# Patient Record
Sex: Female | Born: 1970 | Race: Black or African American | Hispanic: No | Marital: Married | Smoking: Never smoker
Health system: Southern US, Community
[De-identification: ages and names within clinical notes are randomized; demographics above are authoritative.]

## PROBLEM LIST (undated history)

## (undated) ENCOUNTER — Inpatient Hospital Stay (HOSPITAL_COMMUNITY): Payer: Self-pay

## (undated) DIAGNOSIS — D649 Anemia, unspecified: Secondary | ICD-10-CM

## (undated) DIAGNOSIS — E785 Hyperlipidemia, unspecified: Secondary | ICD-10-CM

## (undated) DIAGNOSIS — A6000 Herpesviral infection of urogenital system, unspecified: Secondary | ICD-10-CM

## (undated) HISTORY — DX: Hyperlipidemia, unspecified: E78.5

## (undated) HISTORY — PX: HYSTEROSCOPY: SHX211

## (undated) HISTORY — PX: TUBAL LIGATION: SHX77

## (undated) HISTORY — PX: CERVICAL POLYPECTOMY: SHX88

---

## 1998-04-22 ENCOUNTER — Emergency Department (HOSPITAL_COMMUNITY): Admission: EM | Admit: 1998-04-22 | Discharge: 1998-04-22 | Payer: Self-pay | Admitting: Emergency Medicine

## 1999-04-04 ENCOUNTER — Other Ambulatory Visit: Admission: RE | Admit: 1999-04-04 | Discharge: 1999-04-04 | Payer: Self-pay | Admitting: Obstetrics

## 1999-04-29 ENCOUNTER — Inpatient Hospital Stay (HOSPITAL_COMMUNITY): Admission: AD | Admit: 1999-04-29 | Discharge: 1999-04-29 | Payer: Self-pay | Admitting: Obstetrics

## 1999-08-02 ENCOUNTER — Other Ambulatory Visit: Admission: RE | Admit: 1999-08-02 | Discharge: 1999-08-02 | Payer: Self-pay | Admitting: Obstetrics

## 1999-11-01 ENCOUNTER — Inpatient Hospital Stay (HOSPITAL_COMMUNITY): Admission: AD | Admit: 1999-11-01 | Discharge: 1999-11-01 | Payer: Self-pay | Admitting: Internal Medicine

## 1999-11-15 ENCOUNTER — Inpatient Hospital Stay (HOSPITAL_COMMUNITY): Admission: AD | Admit: 1999-11-15 | Discharge: 1999-11-15 | Payer: Self-pay | Admitting: Obstetrics

## 1999-11-23 ENCOUNTER — Inpatient Hospital Stay (HOSPITAL_COMMUNITY): Admission: AD | Admit: 1999-11-23 | Discharge: 1999-11-25 | Payer: Self-pay | Admitting: Obstetrics

## 2000-09-17 ENCOUNTER — Other Ambulatory Visit: Admission: RE | Admit: 2000-09-17 | Discharge: 2000-09-17 | Payer: Self-pay | Admitting: Obstetrics

## 2001-12-02 ENCOUNTER — Encounter: Admission: RE | Admit: 2001-12-02 | Discharge: 2001-12-02 | Payer: Self-pay | Admitting: Obstetrics

## 2001-12-02 ENCOUNTER — Encounter: Payer: Self-pay | Admitting: Obstetrics

## 2002-05-29 ENCOUNTER — Encounter: Payer: Self-pay | Admitting: Obstetrics

## 2002-05-29 ENCOUNTER — Encounter: Admission: RE | Admit: 2002-05-29 | Discharge: 2002-05-29 | Payer: Self-pay | Admitting: Obstetrics

## 2009-08-13 ENCOUNTER — Ambulatory Visit (HOSPITAL_COMMUNITY): Admission: RE | Admit: 2009-08-13 | Discharge: 2009-08-13 | Payer: Self-pay | Admitting: Obstetrics

## 2009-09-09 ENCOUNTER — Ambulatory Visit (HOSPITAL_COMMUNITY): Admission: RE | Admit: 2009-09-09 | Discharge: 2009-09-09 | Payer: Self-pay | Admitting: Obstetrics

## 2011-01-25 LAB — CBC
HCT: 28.9 % — ABNORMAL LOW (ref 36.0–46.0)
Hemoglobin: 9.6 g/dL — ABNORMAL LOW (ref 12.0–15.0)
MCHC: 33.1 g/dL (ref 30.0–36.0)
MCV: 84.1 fL (ref 78.0–100.0)
Platelets: 226 10*3/uL (ref 150–400)
RBC: 3.44 MIL/uL — ABNORMAL LOW (ref 3.87–5.11)
RDW: 15.8 % — ABNORMAL HIGH (ref 11.5–15.5)
WBC: 6.4 10*3/uL (ref 4.0–10.5)

## 2011-10-31 ENCOUNTER — Encounter (HOSPITAL_COMMUNITY): Payer: Self-pay

## 2011-10-31 ENCOUNTER — Inpatient Hospital Stay (HOSPITAL_COMMUNITY): Payer: BC Managed Care – PPO

## 2011-10-31 ENCOUNTER — Inpatient Hospital Stay (HOSPITAL_COMMUNITY)
Admission: AD | Admit: 2011-10-31 | Discharge: 2011-10-31 | Disposition: A | Discharge status: Home / Self Care | Payer: BC Managed Care – PPO | Source: Ambulatory Visit | Attending: Obstetrics | Admitting: Obstetrics

## 2011-10-31 DIAGNOSIS — O99891 Other specified diseases and conditions complicating pregnancy: Secondary | ICD-10-CM | POA: Insufficient documentation

## 2011-10-31 DIAGNOSIS — R109 Unspecified abdominal pain: Secondary | ICD-10-CM

## 2011-10-31 DIAGNOSIS — O26899 Other specified pregnancy related conditions, unspecified trimester: Secondary | ICD-10-CM

## 2011-10-31 HISTORY — DX: Herpesviral infection of urogenital system, unspecified: A60.00

## 2011-10-31 HISTORY — DX: Anemia, unspecified: D64.9

## 2011-10-31 LAB — URINALYSIS, ROUTINE W REFLEX MICROSCOPIC
Bilirubin Urine: NEGATIVE
Glucose, UA: NEGATIVE mg/dL
Hgb urine dipstick: NEGATIVE
Ketones, ur: 15 mg/dL — AB
Leukocytes, UA: NEGATIVE
Protein, ur: NEGATIVE mg/dL
pH: 6 (ref 5.0–8.0)

## 2011-10-31 LAB — CBC
HCT: 32.5 % — ABNORMAL LOW (ref 36.0–46.0)
Hemoglobin: 10.7 g/dL — ABNORMAL LOW (ref 12.0–15.0)
MCH: 28.8 pg (ref 26.0–34.0)
MCHC: 32.9 g/dL (ref 30.0–36.0)
MCV: 87.4 fL (ref 78.0–100.0)
RBC: 3.72 MIL/uL — ABNORMAL LOW (ref 3.87–5.11)

## 2011-10-31 MED ORDER — NIFEDIPINE ER OSMOTIC RELEASE 60 MG PO TB24: 60.0000 mg | ORAL_TABLET | Freq: Every day | ORAL | Status: DC

## 2011-10-31 MED ORDER — TERBUTALINE SULFATE 1 MG/ML IJ SOLN
0.2500 mg | Freq: Once | INTRAMUSCULAR | Status: AC
  Administered 2011-10-31: 0.25 mg via SUBCUTANEOUS
  Filled 2011-10-31: qty 1

## 2011-10-31 MED ORDER — LACTATED RINGERS IV BOLUS (SEPSIS)
1000.0000 mL | Freq: Once | INTRAVENOUS | Status: AC
  Administered 2011-10-31: 1000 mL via INTRAVENOUS

## 2011-10-31 NOTE — ED Provider Notes (Signed)
History   Pt presents today c/o severe lower abd pain that has been present for the last couple of days. She was seen in [facility] yesterday and told everything was fine. However, the pain continues so she came to [facility]U. She denies recent intercourse, vag bleeding, dysuria, fever, or any other sx at this time. She states she feels like she is having ctx.  Chief Complaint  Patient presents with  . Abdominal Pain   HPI  OB History    Grav Para Term Preterm Abortions TAB SAB Ect Mult Living   4 2 2  1 1    2       Past Medical History  Diagnosis Date  . Anemia   . Gestational diabetes   . Herpes genitalia     Past Surgical History  Procedure Date  . Cervical polypectomy     Family History  Problem Relation Age of Onset  . Anesthesia problems Neg Hx     History  Substance Use Topics  . Smoking status: Never Smoker   . Smokeless tobacco: Not on file  . Alcohol Use: No    Allergies:  Allergies  Allergen Reactions  . Amoxicillin Hives  . Penicillins Hives    Prescriptions prior to admission  Medication Sig Dispense Refill  . Prenatal Vit-Fe Fumarate-FA (PRENATAL MULTIVITAMIN) TABS Take 1 tablet by mouth daily.          Review of Systems  Constitutional: Negative for fever and malaise/fatigue.  Eyes: Negative for blurred vision.  Cardiovascular: Negative for chest pain and palpitations.  Gastrointestinal: Positive for abdominal pain. Negative for nausea, vomiting, diarrhea and constipation.  Genitourinary: Negative for dysuria, urgency, frequency and hematuria.  Neurological: Negative for dizziness and headaches.  Psychiatric/Behavioral: Negative for depression and suicidal ideas.   Physical Exam   Blood pressure 108/59, pulse 92, temperature 99 F (37.2 C), temperature source Oral, resp. rate 16, height 5' 4.5" (1.638 m), weight 216 lb 12.8 oz (98.34 kg), SpO2 99.00%.  Physical Exam  Nursing note and vitals reviewed. Constitutional: She is oriented to person,  place, and time. She appears well-developed and well-nourished. No distress.  HENT:  Head: Normocephalic and atraumatic.  Eyes: EOM are normal. Pupils are equal, round, and reactive to light.  GI: Soft. She exhibits no distension and no mass. There is tenderness. There is no rebound and no guarding.  Genitourinary: No bleeding around the vagina. No vaginal discharge found.       Cervix 1/50/-3.  Neurological: She is alert and oriented to person, place, and time.  Skin: Skin is warm and dry. She is not diaphoretic.  Psychiatric: She has a normal mood and affect. Her behavior is normal. Judgment and thought content normal.    MAU Course  Procedures  Discussed pt with Dr. Gaynell Face. He ordered terbutaline and IV hydration.  Results for orders placed during the hospital encounter of 10/31/11 (from the past 24 hour(s))  CBC     Status: Abnormal   Collection Time   10/31/11 11:15 AM      Component Value Range   WBC 11.7 (*) 4.0 - 10.5 (K/uL)   RBC 3.72 (*) 3.87 - 5.11 (MIL/uL)   Hemoglobin 10.7 (*) 12.0 - 15.0 (g/dL)   HCT 16.1 (*) 09.6 - 46.0 (%)   MCV 87.4  78.0 - 100.0 (fL)   MCH 28.8  26.0 - 34.0 (pg)   MCHC 32.9  30.0 - 36.0 (g/dL)   RDW 04.5  40.9 - 81.1 (%)  Platelets 177  150 - 400 (K/uL)  URINALYSIS, ROUTINE W REFLEX MICROSCOPIC     Status: Abnormal   Collection Time   10/31/11 11:15 AM      Component Value Range   Color, Urine YELLOW  YELLOW    APPearance CLEAR  CLEAR    Specific Gravity, Urine 1.025  1.005 - 1.030    pH 6.0  5.0 - 8.0    Glucose, UA NEGATIVE  NEGATIVE (mg/dL)   Hgb urine dipstick NEGATIVE  NEGATIVE    Bilirubin Urine NEGATIVE  NEGATIVE    Ketones, ur 15 (*) NEGATIVE (mg/dL)   Protein, ur NEGATIVE  NEGATIVE (mg/dL)   Urobilinogen, UA 0.2  0.0 - 1.0 (mg/dL)   Nitrite NEGATIVE  NEGATIVE    Leukocytes, UA NEGATIVE  NEGATIVE    US shows cervical length of 4cm. No funneling noted.  Discussed pt with Dr. Gaynell Face. Will start procardia XL 60mg  q day. She  will f/u in the office on Tuesday.  Assessment and Plan  Abd pain in preg: discussed with pt at length. Will start procardia XL 60mg  q day. She will f/u with Dr. Gaynell Face. Discussed diet, activity, risks,a nd precautions.  Lindsay Hurley, DrHSc, MPAS, PA-C  10/31/2011, 11:04 AM   Lindsay Hoover, PA 10/31/11 1314

## 2011-10-31 NOTE — Progress Notes (Signed)
Patient has had no prenatal care. First appointment with Dr. Gaynell Face is 1-15.

## 2011-10-31 NOTE — Progress Notes (Signed)
Patient states she has had abdominal pain since 1-6, low to mid abdomen, that is worse with movement. Was seen at [facility] 1-7 for a complete evaluation but the patient states the pain continues. Patient denies any bleeding, leaking, nausea, vomiting or diarrhea.

## 2011-11-07 LAB — OB RESULTS CONSOLE RUBELLA ANTIBODY, IGM: Rubella: IMMUNE

## 2011-11-07 LAB — OB RESULTS CONSOLE HEPATITIS B SURFACE ANTIGEN: Hepatitis B Surface Ag: NEGATIVE

## 2011-11-07 LAB — OB RESULTS CONSOLE ABO/RH

## 2011-11-07 LAB — OB RESULTS CONSOLE HIV ANTIBODY (ROUTINE TESTING): HIV: NONREACTIVE

## 2011-11-07 LAB — OB RESULTS CONSOLE ANTIBODY SCREEN: Antibody Screen: NEGATIVE

## 2011-11-08 ENCOUNTER — Other Ambulatory Visit (HOSPITAL_COMMUNITY): Payer: Self-pay | Admitting: Obstetrics

## 2011-11-08 DIAGNOSIS — Z0489 Encounter for examination and observation for other specified reasons: Secondary | ICD-10-CM

## 2011-11-15 ENCOUNTER — Encounter (HOSPITAL_COMMUNITY): Payer: Self-pay

## 2011-11-15 ENCOUNTER — Ambulatory Visit (HOSPITAL_COMMUNITY)
Admission: RE | Admit: 2011-11-15 | Discharge: 2011-11-15 | Disposition: A | Discharge status: Home / Self Care | Payer: BC Managed Care – PPO | Source: Ambulatory Visit | Attending: Obstetrics | Admitting: Obstetrics

## 2011-11-15 DIAGNOSIS — O98519 Other viral diseases complicating pregnancy, unspecified trimester: Secondary | ICD-10-CM | POA: Insufficient documentation

## 2011-11-15 DIAGNOSIS — O358XX0 Maternal care for other (suspected) fetal abnormality and damage, not applicable or unspecified: Secondary | ICD-10-CM

## 2011-11-15 DIAGNOSIS — Z1389 Encounter for screening for other disorder: Secondary | ICD-10-CM | POA: Insufficient documentation

## 2011-11-15 DIAGNOSIS — Z363 Encounter for antenatal screening for malformations: Secondary | ICD-10-CM | POA: Insufficient documentation

## 2011-11-15 DIAGNOSIS — O0930 Supervision of pregnancy with insufficient antenatal care, unspecified trimester: Secondary | ICD-10-CM | POA: Insufficient documentation

## 2011-11-15 DIAGNOSIS — Z0489 Encounter for examination and observation for other specified reasons: Secondary | ICD-10-CM

## 2011-11-15 DIAGNOSIS — O09529 Supervision of elderly multigravida, unspecified trimester: Secondary | ICD-10-CM | POA: Insufficient documentation

## 2011-11-15 DIAGNOSIS — A6000 Herpesviral infection of urogenital system, unspecified: Secondary | ICD-10-CM | POA: Insufficient documentation

## 2011-11-15 NOTE — Progress Notes (Signed)
Genetic Counseling  High-Risk Gestation Note  Appointment Date:  11/15/2011 Referred By: Kathreen Cosier, MD Date of Birth:  1971-03-22  Pregnancy History: [id number] Estimated Date of Delivery: 04/01/12 Estimated Gestational Age: [redacted]w[redacted]d   Mrs. Lindsay Hurley was seen for genetic counseling because of a maternal age of 41 y.o.  She was counseled regarding maternal age and the association with risk for chromosome conditions due to nondisjunction with aging of the ova.   We reviewed chromosomes, nondisjunction, and the associated 1 in 4 risk for fetal aneuploidy related to a maternal age of 41 y.o. at [redacted]w[redacted]d gestation.  She was counseled that the risk for aneuploidy decreases as gestational age increases, accounting for those pregnancies which spontaneously abort.  We specifically discussed Down syndrome (trisomy 58), trisomies 56 and 42, and sex chromosome aneuploidies (47,XXX and 47,XXY) including the common features and prognoses of each.   We reviewed available screening and diagnostic options.  Regarding screening tests, we discussed the options of Quad screen and ultrasound.  In addition, we discussed another type of screening test, noninvasive prenatal testing (NIPT), which utilizes cell free fetal DNA found in the maternal circulation. This test is not diagnostic for chromosome conditions, but can provide information regarding the presence or absence of extra fetal DNA for chromosomes 13, 18 and 21. The reported detection rate is greater than 99% for Trisomy 21, greater than 97% for Trisomy 18, and is approximately 80% (8 out of 10) for Trisomy 13. The false positive rate is thought to be less than 1% for any of these conditions. We discussed that screening options are used to modify a patient's a priori risk for aneuploidy, typically based on age.  This estimate provides a pregnancy specific risk assessment.    We also reviewed the availability of diagnostic testing via amniocentesis.  We  discussed the risks, limitations, and benefits of each.  After consideration of all the options, Lindsay Hurley elected to proceed with ultrasound, but declined all other testing options.  She understands that ultrasound cannot rule out all birth defects or genetic syndromes. However, she was counseled that 50-80% of fetuses with Down syndrome and up to 90% of fetuses with trisomies 13 and 18, when well visualized, have detectable anomalies or soft markers by ultrasound. A detailed ultrasound was performed today.  The report is documented separately.  Lindsay Hurley was provided with written information regarding sickle cell anemia (SCA) including the carrier frequency and incidence in the African-American population, the availability of carrier testing and prenatal diagnosis if indicated.  In addition, we discussed that hemoglobinopathies are routinely screened for as part of the [place] newborn screening panel.  She declined hemoglobin electrophoresis today.  Both family histories were reviewed and found to be noncontributory for birth defects, mental retardation, and known genetic conditions. Without further information regarding the provided family history, an accurate genetic risk cannot be calculated. Further genetic counseling is warranted if more information is obtained.  Lindsay Hurley denied exposure to environmental toxins or chemical agents. She denied the use of alcohol, tobacco or street drugs. She denied significant viral illnesses during the course of her pregnancy. Her medical and surgical histories were contributory for a prior SAB and TAB.   I counseled Lindsay Hurley regarding the above risks and available options.  The approximate face-to-face time with the genetic counselor was 36 minutes.  Donald Prose, MS  Certified Genetic Counselor

## 2011-12-06 ENCOUNTER — Inpatient Hospital Stay (HOSPITAL_COMMUNITY): Admission: RE | Admit: 2011-12-06 | Payer: BC Managed Care – PPO | Source: Ambulatory Visit

## 2011-12-11 ENCOUNTER — Ambulatory Visit (HOSPITAL_COMMUNITY)
Admission: RE | Admit: 2011-12-11 | Discharge: 2011-12-11 | Disposition: A | Discharge status: Home / Self Care | Payer: BC Managed Care – PPO | Source: Ambulatory Visit | Attending: Obstetrics | Admitting: Obstetrics

## 2011-12-11 DIAGNOSIS — O0930 Supervision of pregnancy with insufficient antenatal care, unspecified trimester: Secondary | ICD-10-CM | POA: Insufficient documentation

## 2011-12-11 DIAGNOSIS — O3440 Maternal care for other abnormalities of cervix, unspecified trimester: Secondary | ICD-10-CM | POA: Insufficient documentation

## 2011-12-11 DIAGNOSIS — O09529 Supervision of elderly multigravida, unspecified trimester: Secondary | ICD-10-CM | POA: Insufficient documentation

## 2011-12-11 DIAGNOSIS — A6000 Herpesviral infection of urogenital system, unspecified: Secondary | ICD-10-CM | POA: Insufficient documentation

## 2011-12-11 DIAGNOSIS — O99214 Obesity complicating childbirth: Secondary | ICD-10-CM

## 2011-12-11 DIAGNOSIS — O3410 Maternal care for benign tumor of corpus uteri, unspecified trimester: Secondary | ICD-10-CM | POA: Insufficient documentation

## 2011-12-11 DIAGNOSIS — O358XX0 Maternal care for other (suspected) fetal abnormality and damage, not applicable or unspecified: Secondary | ICD-10-CM

## 2011-12-11 DIAGNOSIS — O98519 Other viral diseases complicating pregnancy, unspecified trimester: Secondary | ICD-10-CM | POA: Insufficient documentation

## 2011-12-11 NOTE — Progress Notes (Signed)
Lindsay Hurley was seen for ultrasound appointment today.  Please see [facility] report for details.

## 2012-01-08 ENCOUNTER — Ambulatory Visit (HOSPITAL_COMMUNITY)
Admission: RE | Admit: 2012-01-08 | Discharge: 2012-01-08 | Disposition: A | Discharge status: Home / Self Care | Payer: BC Managed Care – PPO | Source: Ambulatory Visit | Attending: Obstetrics | Admitting: Obstetrics

## 2012-01-08 VITALS — BP 113/53 | HR 95 | Wt 231.0 lb

## 2012-01-08 DIAGNOSIS — O3410 Maternal care for benign tumor of corpus uteri, unspecified trimester: Secondary | ICD-10-CM | POA: Insufficient documentation

## 2012-01-08 DIAGNOSIS — O98519 Other viral diseases complicating pregnancy, unspecified trimester: Secondary | ICD-10-CM | POA: Insufficient documentation

## 2012-01-08 DIAGNOSIS — O09529 Supervision of elderly multigravida, unspecified trimester: Secondary | ICD-10-CM | POA: Insufficient documentation

## 2012-01-08 DIAGNOSIS — A6000 Herpesviral infection of urogenital system, unspecified: Secondary | ICD-10-CM | POA: Insufficient documentation

## 2012-01-08 DIAGNOSIS — O3440 Maternal care for other abnormalities of cervix, unspecified trimester: Secondary | ICD-10-CM | POA: Insufficient documentation

## 2012-01-08 DIAGNOSIS — O99214 Obesity complicating childbirth: Secondary | ICD-10-CM

## 2012-01-08 DIAGNOSIS — O09299 Supervision of pregnancy with other poor reproductive or obstetric history, unspecified trimester: Secondary | ICD-10-CM | POA: Insufficient documentation

## 2012-01-08 NOTE — ED Notes (Signed)
Patient denies any contractions, leaking, bleeding and reports positive fetal movement.

## 2012-02-19 ENCOUNTER — Ambulatory Visit (HOSPITAL_COMMUNITY): Payer: BC Managed Care – PPO

## 2012-02-21 ENCOUNTER — Ambulatory Visit (HOSPITAL_COMMUNITY)
Admission: RE | Admit: 2012-02-21 | Discharge: 2012-02-21 | Disposition: A | Discharge status: Home / Self Care | Payer: BC Managed Care – PPO | Source: Ambulatory Visit | Attending: Obstetrics | Admitting: Obstetrics

## 2012-02-21 ENCOUNTER — Encounter (HOSPITAL_COMMUNITY): Payer: Self-pay

## 2012-02-21 VITALS — BP 98/62 | HR 90 | Wt 242.0 lb

## 2012-02-21 DIAGNOSIS — O09299 Supervision of pregnancy with other poor reproductive or obstetric history, unspecified trimester: Secondary | ICD-10-CM | POA: Insufficient documentation

## 2012-02-21 DIAGNOSIS — O3410 Maternal care for benign tumor of corpus uteri, unspecified trimester: Secondary | ICD-10-CM | POA: Insufficient documentation

## 2012-02-21 DIAGNOSIS — O3440 Maternal care for other abnormalities of cervix, unspecified trimester: Secondary | ICD-10-CM | POA: Insufficient documentation

## 2012-02-21 DIAGNOSIS — A6000 Herpesviral infection of urogenital system, unspecified: Secondary | ICD-10-CM | POA: Insufficient documentation

## 2012-02-21 DIAGNOSIS — O98519 Other viral diseases complicating pregnancy, unspecified trimester: Secondary | ICD-10-CM | POA: Insufficient documentation

## 2012-02-21 DIAGNOSIS — O09529 Supervision of elderly multigravida, unspecified trimester: Secondary | ICD-10-CM | POA: Insufficient documentation

## 2012-02-21 DIAGNOSIS — O3660X0 Maternal care for excessive fetal growth, unspecified trimester, not applicable or unspecified: Secondary | ICD-10-CM

## 2012-02-21 DIAGNOSIS — O99214 Obesity complicating childbirth: Secondary | ICD-10-CM

## 2012-02-21 NOTE — Progress Notes (Signed)
Patient seen for OB ultrasound.  See report in [facility].   Alpha Gula, MD  Single IUP at 34 2/7 weeks by dates and 36 3/7 weeks by ultrasound today. The estimated fetal weight currently at 85th percentile. Polyhydramnios is noted with an AFI of 32 cm. Per report, had a normal 3-hr OGTT.  Recommend follow up ultrasound in 3 weeks for interval growth.  Recommend initiating 2x weekly NSTs due to polyhydramnios.

## 2012-02-26 ENCOUNTER — Ambulatory Visit (HOSPITAL_COMMUNITY)
Admission: RE | Admit: 2012-02-26 | Discharge: 2012-02-26 | Disposition: A | Discharge status: Home / Self Care | Payer: BC Managed Care – PPO | Source: Ambulatory Visit | Attending: Obstetrics | Admitting: Obstetrics

## 2012-02-26 ENCOUNTER — Other Ambulatory Visit (HOSPITAL_COMMUNITY): Payer: Self-pay | Admitting: Maternal and Fetal Medicine

## 2012-02-26 DIAGNOSIS — O99214 Obesity complicating childbirth: Secondary | ICD-10-CM

## 2012-02-26 DIAGNOSIS — O3410 Maternal care for benign tumor of corpus uteri, unspecified trimester: Secondary | ICD-10-CM | POA: Insufficient documentation

## 2012-02-26 DIAGNOSIS — O3660X0 Maternal care for excessive fetal growth, unspecified trimester, not applicable or unspecified: Secondary | ICD-10-CM

## 2012-02-26 DIAGNOSIS — O98519 Other viral diseases complicating pregnancy, unspecified trimester: Secondary | ICD-10-CM | POA: Insufficient documentation

## 2012-02-26 DIAGNOSIS — O3440 Maternal care for other abnormalities of cervix, unspecified trimester: Secondary | ICD-10-CM | POA: Insufficient documentation

## 2012-02-26 DIAGNOSIS — A6000 Herpesviral infection of urogenital system, unspecified: Secondary | ICD-10-CM | POA: Insufficient documentation

## 2012-02-26 DIAGNOSIS — O09299 Supervision of pregnancy with other poor reproductive or obstetric history, unspecified trimester: Secondary | ICD-10-CM | POA: Insufficient documentation

## 2012-02-26 DIAGNOSIS — O09529 Supervision of elderly multigravida, unspecified trimester: Secondary | ICD-10-CM | POA: Insufficient documentation

## 2012-02-26 NOTE — Progress Notes (Signed)
Patient seen for BPP.  See ultrasound report in [facility].  Alpha Gula, MD

## 2012-02-28 ENCOUNTER — Other Ambulatory Visit (HOSPITAL_COMMUNITY): Payer: Self-pay | Admitting: Maternal and Fetal Medicine

## 2012-02-28 DIAGNOSIS — O99214 Obesity complicating childbirth: Secondary | ICD-10-CM

## 2012-02-28 DIAGNOSIS — O3660X0 Maternal care for excessive fetal growth, unspecified trimester, not applicable or unspecified: Secondary | ICD-10-CM

## 2012-02-29 ENCOUNTER — Ambulatory Visit (HOSPITAL_COMMUNITY)
Admission: RE | Admit: 2012-02-29 | Discharge: 2012-02-29 | Disposition: A | Discharge status: Home / Self Care | Payer: BC Managed Care – PPO | Source: Ambulatory Visit | Attending: Obstetrics | Admitting: Obstetrics

## 2012-02-29 DIAGNOSIS — O3410 Maternal care for benign tumor of corpus uteri, unspecified trimester: Secondary | ICD-10-CM | POA: Insufficient documentation

## 2012-02-29 DIAGNOSIS — O409XX0 Polyhydramnios, unspecified trimester, not applicable or unspecified: Secondary | ICD-10-CM | POA: Insufficient documentation

## 2012-02-29 DIAGNOSIS — O99214 Obesity complicating childbirth: Secondary | ICD-10-CM

## 2012-02-29 DIAGNOSIS — O09529 Supervision of elderly multigravida, unspecified trimester: Secondary | ICD-10-CM | POA: Insufficient documentation

## 2012-02-29 DIAGNOSIS — O3440 Maternal care for other abnormalities of cervix, unspecified trimester: Secondary | ICD-10-CM | POA: Insufficient documentation

## 2012-02-29 DIAGNOSIS — O09299 Supervision of pregnancy with other poor reproductive or obstetric history, unspecified trimester: Secondary | ICD-10-CM | POA: Insufficient documentation

## 2012-02-29 DIAGNOSIS — A6000 Herpesviral infection of urogenital system, unspecified: Secondary | ICD-10-CM | POA: Insufficient documentation

## 2012-02-29 DIAGNOSIS — O3660X0 Maternal care for excessive fetal growth, unspecified trimester, not applicable or unspecified: Secondary | ICD-10-CM

## 2012-02-29 DIAGNOSIS — O98519 Other viral diseases complicating pregnancy, unspecified trimester: Secondary | ICD-10-CM | POA: Insufficient documentation

## 2012-02-29 NOTE — ED Notes (Signed)
Patient reports positive fetal movement.  Patient denies leaking, bleeding, HA or VD. Has occasional mild contractions.

## 2012-03-04 ENCOUNTER — Ambulatory Visit (HOSPITAL_COMMUNITY)
Admission: RE | Admit: 2012-03-04 | Discharge: 2012-03-04 | Disposition: A | Discharge status: Home / Self Care | Payer: BC Managed Care – PPO | Source: Ambulatory Visit | Attending: Obstetrics | Admitting: Obstetrics

## 2012-03-04 VITALS — BP 113/79 | HR 106 | Wt 245.0 lb

## 2012-03-04 DIAGNOSIS — O3440 Maternal care for other abnormalities of cervix, unspecified trimester: Secondary | ICD-10-CM | POA: Insufficient documentation

## 2012-03-04 DIAGNOSIS — O09529 Supervision of elderly multigravida, unspecified trimester: Secondary | ICD-10-CM | POA: Insufficient documentation

## 2012-03-04 DIAGNOSIS — O09299 Supervision of pregnancy with other poor reproductive or obstetric history, unspecified trimester: Secondary | ICD-10-CM | POA: Insufficient documentation

## 2012-03-04 DIAGNOSIS — O3410 Maternal care for benign tumor of corpus uteri, unspecified trimester: Secondary | ICD-10-CM | POA: Insufficient documentation

## 2012-03-04 DIAGNOSIS — O99214 Obesity complicating childbirth: Secondary | ICD-10-CM

## 2012-03-04 DIAGNOSIS — O98519 Other viral diseases complicating pregnancy, unspecified trimester: Secondary | ICD-10-CM | POA: Insufficient documentation

## 2012-03-04 DIAGNOSIS — O409XX0 Polyhydramnios, unspecified trimester, not applicable or unspecified: Secondary | ICD-10-CM | POA: Insufficient documentation

## 2012-03-04 DIAGNOSIS — A6000 Herpesviral infection of urogenital system, unspecified: Secondary | ICD-10-CM | POA: Insufficient documentation

## 2012-03-04 DIAGNOSIS — O3660X0 Maternal care for excessive fetal growth, unspecified trimester, not applicable or unspecified: Secondary | ICD-10-CM

## 2012-03-07 ENCOUNTER — Ambulatory Visit (HOSPITAL_COMMUNITY)
Admission: RE | Admit: 2012-03-07 | Discharge: 2012-03-07 | Disposition: A | Discharge status: Home / Self Care | Payer: BC Managed Care – PPO | Source: Ambulatory Visit | Attending: Obstetrics | Admitting: Obstetrics

## 2012-03-07 VITALS — BP 104/71 | HR 113 | Wt 243.0 lb

## 2012-03-07 DIAGNOSIS — O09529 Supervision of elderly multigravida, unspecified trimester: Secondary | ICD-10-CM | POA: Insufficient documentation

## 2012-03-07 DIAGNOSIS — O3410 Maternal care for benign tumor of corpus uteri, unspecified trimester: Secondary | ICD-10-CM | POA: Insufficient documentation

## 2012-03-07 DIAGNOSIS — O3660X0 Maternal care for excessive fetal growth, unspecified trimester, not applicable or unspecified: Secondary | ICD-10-CM

## 2012-03-07 DIAGNOSIS — O409XX0 Polyhydramnios, unspecified trimester, not applicable or unspecified: Secondary | ICD-10-CM | POA: Insufficient documentation

## 2012-03-07 DIAGNOSIS — A6000 Herpesviral infection of urogenital system, unspecified: Secondary | ICD-10-CM | POA: Insufficient documentation

## 2012-03-07 DIAGNOSIS — O98519 Other viral diseases complicating pregnancy, unspecified trimester: Secondary | ICD-10-CM | POA: Insufficient documentation

## 2012-03-07 DIAGNOSIS — O99214 Obesity complicating childbirth: Secondary | ICD-10-CM

## 2012-03-07 DIAGNOSIS — O09299 Supervision of pregnancy with other poor reproductive or obstetric history, unspecified trimester: Secondary | ICD-10-CM | POA: Insufficient documentation

## 2012-03-07 DIAGNOSIS — O3440 Maternal care for other abnormalities of cervix, unspecified trimester: Secondary | ICD-10-CM | POA: Insufficient documentation

## 2012-03-07 NOTE — Progress Notes (Signed)
Lindsay Hurley was seen for ultrasound appointment today.  Please see [facility] report for details.

## 2012-03-11 ENCOUNTER — Ambulatory Visit (HOSPITAL_COMMUNITY)
Admission: RE | Admit: 2012-03-11 | Discharge: 2012-03-11 | Disposition: A | Discharge status: Home / Self Care | Payer: BC Managed Care – PPO | Source: Ambulatory Visit | Attending: Obstetrics | Admitting: Obstetrics

## 2012-03-11 VITALS — BP 108/65 | HR 92 | Wt 244.0 lb

## 2012-03-11 DIAGNOSIS — O09529 Supervision of elderly multigravida, unspecified trimester: Secondary | ICD-10-CM | POA: Insufficient documentation

## 2012-03-11 DIAGNOSIS — O3440 Maternal care for other abnormalities of cervix, unspecified trimester: Secondary | ICD-10-CM | POA: Insufficient documentation

## 2012-03-11 DIAGNOSIS — O09299 Supervision of pregnancy with other poor reproductive or obstetric history, unspecified trimester: Secondary | ICD-10-CM | POA: Insufficient documentation

## 2012-03-11 DIAGNOSIS — O3410 Maternal care for benign tumor of corpus uteri, unspecified trimester: Secondary | ICD-10-CM | POA: Insufficient documentation

## 2012-03-11 DIAGNOSIS — O98519 Other viral diseases complicating pregnancy, unspecified trimester: Secondary | ICD-10-CM | POA: Insufficient documentation

## 2012-03-11 DIAGNOSIS — O99214 Obesity complicating childbirth: Secondary | ICD-10-CM

## 2012-03-11 DIAGNOSIS — O3660X0 Maternal care for excessive fetal growth, unspecified trimester, not applicable or unspecified: Secondary | ICD-10-CM

## 2012-03-11 DIAGNOSIS — A6000 Herpesviral infection of urogenital system, unspecified: Secondary | ICD-10-CM | POA: Insufficient documentation

## 2012-03-11 DIAGNOSIS — O409XX0 Polyhydramnios, unspecified trimester, not applicable or unspecified: Secondary | ICD-10-CM | POA: Insufficient documentation

## 2012-03-11 NOTE — Progress Notes (Signed)
Patient seen today  for follow up ultrasound.  See full report in [facility].  Alpha Gula, MD  IUP at 37+0 weeks Interval growth is appropriate (81st percentile) BPP 8/8 Mild polyhydramnios (AFI 24 cm)  Continue twice weekly NSTs with weekly AFIs or BPPs

## 2012-03-12 ENCOUNTER — Other Ambulatory Visit (HOSPITAL_COMMUNITY): Payer: BC Managed Care – PPO

## 2012-03-12 ENCOUNTER — Ambulatory Visit (HOSPITAL_COMMUNITY): Payer: BC Managed Care – PPO

## 2012-03-14 ENCOUNTER — Ambulatory Visit (HOSPITAL_COMMUNITY)
Admission: RE | Admit: 2012-03-14 | Discharge: 2012-03-14 | Disposition: A | Discharge status: Home / Self Care | Payer: BC Managed Care – PPO | Source: Ambulatory Visit | Attending: Obstetrics | Admitting: Obstetrics

## 2012-03-14 DIAGNOSIS — O09529 Supervision of elderly multigravida, unspecified trimester: Secondary | ICD-10-CM | POA: Insufficient documentation

## 2012-03-14 DIAGNOSIS — O3410 Maternal care for benign tumor of corpus uteri, unspecified trimester: Secondary | ICD-10-CM | POA: Insufficient documentation

## 2012-03-14 DIAGNOSIS — O3440 Maternal care for other abnormalities of cervix, unspecified trimester: Secondary | ICD-10-CM | POA: Insufficient documentation

## 2012-03-14 DIAGNOSIS — O09299 Supervision of pregnancy with other poor reproductive or obstetric history, unspecified trimester: Secondary | ICD-10-CM | POA: Insufficient documentation

## 2012-03-14 DIAGNOSIS — O409XX0 Polyhydramnios, unspecified trimester, not applicable or unspecified: Secondary | ICD-10-CM | POA: Insufficient documentation

## 2012-03-14 NOTE — ED Notes (Signed)
Patient reports positive fetal movement, with occasional mild contractions. She denies leaking, bleeding, HA, VD, some mild edema.

## 2012-03-20 ENCOUNTER — Ambulatory Visit (HOSPITAL_COMMUNITY)
Admission: RE | Admit: 2012-03-20 | Discharge: 2012-03-20 | Disposition: A | Discharge status: Home / Self Care | Payer: BC Managed Care – PPO | Source: Ambulatory Visit | Attending: Obstetrics | Admitting: Obstetrics

## 2012-03-20 DIAGNOSIS — A6000 Herpesviral infection of urogenital system, unspecified: Secondary | ICD-10-CM | POA: Insufficient documentation

## 2012-03-20 DIAGNOSIS — O3410 Maternal care for benign tumor of corpus uteri, unspecified trimester: Secondary | ICD-10-CM | POA: Insufficient documentation

## 2012-03-20 DIAGNOSIS — O3440 Maternal care for other abnormalities of cervix, unspecified trimester: Secondary | ICD-10-CM | POA: Insufficient documentation

## 2012-03-20 DIAGNOSIS — O09529 Supervision of elderly multigravida, unspecified trimester: Secondary | ICD-10-CM | POA: Insufficient documentation

## 2012-03-20 DIAGNOSIS — O98519 Other viral diseases complicating pregnancy, unspecified trimester: Secondary | ICD-10-CM | POA: Insufficient documentation

## 2012-03-20 DIAGNOSIS — O09299 Supervision of pregnancy with other poor reproductive or obstetric history, unspecified trimester: Secondary | ICD-10-CM | POA: Insufficient documentation

## 2012-03-20 DIAGNOSIS — O99214 Obesity complicating childbirth: Secondary | ICD-10-CM

## 2012-03-20 DIAGNOSIS — O409XX0 Polyhydramnios, unspecified trimester, not applicable or unspecified: Secondary | ICD-10-CM | POA: Insufficient documentation

## 2012-03-20 NOTE — Progress Notes (Signed)
Patient seen for follow up BPP.  See report in [facility].  Alpha Gula, MD  IUP at 38 2/7 weeks BPP 8/8 Mild polyhydramnios   Continue twice weekly NSTs with weekly AFIs or BPPs

## 2012-03-21 ENCOUNTER — Telehealth (HOSPITAL_COMMUNITY): Payer: Self-pay | Admitting: *Deleted

## 2012-03-21 ENCOUNTER — Encounter (HOSPITAL_COMMUNITY): Payer: Self-pay | Admitting: *Deleted

## 2012-03-21 NOTE — Telephone Encounter (Signed)
Preadmission screen  

## 2012-03-25 ENCOUNTER — Ambulatory Visit (HOSPITAL_COMMUNITY)
Admission: RE | Admit: 2012-03-25 | Discharge: 2012-03-25 | Disposition: A | Discharge status: Home / Self Care | Payer: BC Managed Care – PPO | Source: Ambulatory Visit | Attending: Maternal and Fetal Medicine | Admitting: Maternal and Fetal Medicine

## 2012-03-25 DIAGNOSIS — O09299 Supervision of pregnancy with other poor reproductive or obstetric history, unspecified trimester: Secondary | ICD-10-CM | POA: Insufficient documentation

## 2012-03-25 DIAGNOSIS — O409XX0 Polyhydramnios, unspecified trimester, not applicable or unspecified: Secondary | ICD-10-CM | POA: Insufficient documentation

## 2012-03-25 DIAGNOSIS — O3440 Maternal care for other abnormalities of cervix, unspecified trimester: Secondary | ICD-10-CM | POA: Insufficient documentation

## 2012-03-25 DIAGNOSIS — O09529 Supervision of elderly multigravida, unspecified trimester: Secondary | ICD-10-CM | POA: Insufficient documentation

## 2012-03-25 DIAGNOSIS — O3410 Maternal care for benign tumor of corpus uteri, unspecified trimester: Secondary | ICD-10-CM | POA: Insufficient documentation

## 2012-03-25 NOTE — ED Notes (Signed)
Pt states + FM.

## 2012-03-26 ENCOUNTER — Encounter (HOSPITAL_COMMUNITY)
Admission: RE | Disposition: A | Discharge status: Home / Self Care | Payer: Self-pay | Source: Ambulatory Visit | Attending: Obstetrics

## 2012-03-26 ENCOUNTER — Inpatient Hospital Stay (HOSPITAL_COMMUNITY): Payer: BC Managed Care – PPO | Admitting: Anesthesiology

## 2012-03-26 ENCOUNTER — Encounter (HOSPITAL_COMMUNITY): Payer: Self-pay | Admitting: Anesthesiology

## 2012-03-26 ENCOUNTER — Encounter (HOSPITAL_COMMUNITY): Payer: Self-pay

## 2012-03-26 ENCOUNTER — Inpatient Hospital Stay (HOSPITAL_COMMUNITY)
Admission: RE | Admit: 2012-03-26 | Discharge: 2012-03-29 | DRG: 371 | Disposition: A | Discharge status: Home / Self Care | Payer: BC Managed Care – PPO | Source: Ambulatory Visit | Attending: Obstetrics | Admitting: Obstetrics

## 2012-03-26 ENCOUNTER — Inpatient Hospital Stay (HOSPITAL_COMMUNITY): Payer: BC Managed Care – PPO

## 2012-03-26 VITALS — BP 115/67 | HR 79 | Temp 97.9°F | Resp 18 | Ht 62.0 in | Wt 248.0 lb

## 2012-03-26 DIAGNOSIS — Z98891 History of uterine scar from previous surgery: Secondary | ICD-10-CM

## 2012-03-26 DIAGNOSIS — O321XX0 Maternal care for breech presentation, not applicable or unspecified: Principal | ICD-10-CM | POA: Diagnosis present

## 2012-03-26 DIAGNOSIS — O34599 Maternal care for other abnormalities of gravid uterus, unspecified trimester: Secondary | ICD-10-CM | POA: Diagnosis present

## 2012-03-26 DIAGNOSIS — D4959 Neoplasm of unspecified behavior of other genitourinary organ: Secondary | ICD-10-CM | POA: Diagnosis present

## 2012-03-26 DIAGNOSIS — O409XX0 Polyhydramnios, unspecified trimester, not applicable or unspecified: Secondary | ICD-10-CM | POA: Diagnosis present

## 2012-03-26 DIAGNOSIS — D259 Leiomyoma of uterus, unspecified: Secondary | ICD-10-CM | POA: Diagnosis present

## 2012-03-26 LAB — CBC
HCT: 28.7 % — ABNORMAL LOW (ref 36.0–46.0)
Platelets: 150 10*3/uL (ref 150–400)
RDW: 15.9 % — ABNORMAL HIGH (ref 11.5–15.5)
WBC: 7.3 10*3/uL (ref 4.0–10.5)

## 2012-03-26 LAB — SURGICAL PCR SCREEN
MRSA, PCR: NEGATIVE
Staphylococcus aureus: NEGATIVE

## 2012-03-26 SURGERY — Surgical Case
Anesthesia: Spinal | Site: Uterus | Wound class: Clean Contaminated

## 2012-03-26 MED ORDER — DIPHENHYDRAMINE HCL 25 MG PO CAPS: 25.0000 mg | ORAL_CAPSULE | ORAL | Status: DC | PRN

## 2012-03-26 MED ORDER — NALBUPHINE HCL 10 MG/ML IJ SOLN
5.0000 mg | INTRAMUSCULAR | Status: DC | PRN
  Administered 2012-03-26: 5 mg via SUBCUTANEOUS
  Filled 2012-03-26: qty 1

## 2012-03-26 MED ORDER — DIPHENHYDRAMINE HCL 50 MG/ML IJ SOLN: 25.0000 mg | INTRAMUSCULAR | Status: DC | PRN

## 2012-03-26 MED ORDER — OXYTOCIN 10 UNIT/ML IJ SOLN
20.0000 [IU] | INTRAVENOUS | Status: DC | PRN
  Administered 2012-03-26: 40 [IU] via INTRAVENOUS

## 2012-03-26 MED ORDER — KETOROLAC TROMETHAMINE 30 MG/ML IJ SOLN: 30.0000 mg | Freq: Four times a day (QID) | INTRAMUSCULAR | Status: AC | PRN

## 2012-03-26 MED ORDER — SENNOSIDES-DOCUSATE SODIUM 8.6-50 MG PO TABS
2.0000 | ORAL_TABLET | Freq: Every day | ORAL | Status: DC
  Administered 2012-03-26 – 2012-03-28 (×3): 2 via ORAL

## 2012-03-26 MED ORDER — BUPIVACAINE-EPINEPHRINE (PF) 0.5% -1:200000 IJ SOLN
INTRAMUSCULAR | Status: AC
  Filled 2012-03-26: qty 10

## 2012-03-26 MED ORDER — NALOXONE HCL 0.4 MG/ML IJ SOLN: 0.4000 mg | INTRAMUSCULAR | Status: DC | PRN

## 2012-03-26 MED ORDER — MORPHINE SULFATE (PF) 0.5 MG/ML IJ SOLN
INTRAMUSCULAR | Status: DC | PRN
  Administered 2012-03-26: .15 mg via INTRATHECAL

## 2012-03-26 MED ORDER — TETANUS-DIPHTH-ACELL PERTUSSIS 5-2.5-18.5 LF-MCG/0.5 IM SUSP
0.5000 mL | Freq: Once | INTRAMUSCULAR | Status: AC
  Administered 2012-03-27: 0.5 mL via INTRAMUSCULAR
  Filled 2012-03-26: qty 0.5

## 2012-03-26 MED ORDER — LIDOCAINE HCL (PF) 1 % IJ SOLN: 30.0000 mL | INTRAMUSCULAR | Status: DC | PRN

## 2012-03-26 MED ORDER — DIPHENHYDRAMINE HCL 50 MG/ML IJ SOLN: 12.5000 mg | INTRAMUSCULAR | Status: DC | PRN

## 2012-03-26 MED ORDER — FENTANYL CITRATE 0.05 MG/ML IJ SOLN
INTRAMUSCULAR | Status: AC
  Filled 2012-03-26: qty 2

## 2012-03-26 MED ORDER — ACETAMINOPHEN 325 MG PO TABS: 650.0000 mg | ORAL_TABLET | ORAL | Status: DC | PRN

## 2012-03-26 MED ORDER — SODIUM CHLORIDE 0.9 % IV SOLN
1.0000 ug/kg/h | INTRAVENOUS | Status: DC | PRN
  Filled 2012-03-26: qty 2.5

## 2012-03-26 MED ORDER — BUPIVACAINE HCL (PF) 0.25 % IJ SOLN
INTRAMUSCULAR | Status: AC
  Filled 2012-03-26: qty 30

## 2012-03-26 MED ORDER — FENTANYL CITRATE 0.05 MG/ML IJ SOLN
INTRAMUSCULAR | Status: DC | PRN
  Administered 2012-03-26: 25 ug via INTRATHECAL

## 2012-03-26 MED ORDER — MENTHOL 3 MG MT LOZG: 1.0000 | LOZENGE | OROMUCOSAL | Status: DC | PRN

## 2012-03-26 MED ORDER — CITRIC ACID-SODIUM CITRATE 334-500 MG/5ML PO SOLN
30.0000 mL | ORAL | Status: DC | PRN
  Administered 2012-03-26: 30 mL via ORAL
  Filled 2012-03-26: qty 15

## 2012-03-26 MED ORDER — SIMETHICONE 80 MG PO CHEW
80.0000 mg | CHEWABLE_TABLET | Freq: Three times a day (TID) | ORAL | Status: DC
  Administered 2012-03-26 – 2012-03-28 (×7): 80 mg via ORAL

## 2012-03-26 MED ORDER — LACTATED RINGERS IV SOLN
INTRAVENOUS | Status: DC
  Administered 2012-03-26 (×2): via INTRAVENOUS
  Administered 2012-03-26: 700 mL via INTRAVENOUS

## 2012-03-26 MED ORDER — SCOPOLAMINE 1 MG/3DAYS TD PT72
1.0000 | MEDICATED_PATCH | Freq: Once | TRANSDERMAL | Status: AC
  Administered 2012-03-26: 1.5 mg via TRANSDERMAL

## 2012-03-26 MED ORDER — CLINDAMYCIN PHOSPHATE 900 MG/50ML IV SOLN
900.0000 mg | Freq: Once | INTRAVENOUS | Status: AC
  Administered 2012-03-26: 900 mg via INTRAVENOUS
  Filled 2012-03-26: qty 50

## 2012-03-26 MED ORDER — SODIUM CHLORIDE 0.9 % IJ SOLN: 3.0000 mL | INTRAMUSCULAR | Status: DC | PRN

## 2012-03-26 MED ORDER — OXYTOCIN 20 UNITS IN LACTATED RINGERS INFUSION - SIMPLE: 125.0000 mL/h | INTRAVENOUS | Status: AC

## 2012-03-26 MED ORDER — ONDANSETRON HCL 4 MG/2ML IJ SOLN
INTRAMUSCULAR | Status: AC
  Filled 2012-03-26: qty 2

## 2012-03-26 MED ORDER — ZOLPIDEM TARTRATE 5 MG PO TABS: 5.0000 mg | ORAL_TABLET | Freq: Every evening | ORAL | Status: DC | PRN

## 2012-03-26 MED ORDER — OXYTOCIN BOLUS FROM INFUSION
500.0000 mL | Freq: Once | INTRAVENOUS | Status: DC
  Filled 2012-03-26: qty 500

## 2012-03-26 MED ORDER — SIMETHICONE 80 MG PO CHEW: 80.0000 mg | CHEWABLE_TABLET | ORAL | Status: DC | PRN

## 2012-03-26 MED ORDER — OXYTOCIN 20 UNITS IN LACTATED RINGERS INFUSION - SIMPLE: 125.0000 mL/h | Freq: Once | INTRAVENOUS | Status: DC

## 2012-03-26 MED ORDER — ONDANSETRON HCL 4 MG PO TABS: 4.0000 mg | ORAL_TABLET | ORAL | Status: DC | PRN

## 2012-03-26 MED ORDER — HYDROMORPHONE HCL PF 1 MG/ML IJ SOLN: 0.2500 mg | INTRAMUSCULAR | Status: DC | PRN

## 2012-03-26 MED ORDER — EPHEDRINE SULFATE 50 MG/ML IJ SOLN
INTRAMUSCULAR | Status: DC | PRN
  Administered 2012-03-26 (×4): 10 mg via INTRAVENOUS

## 2012-03-26 MED ORDER — PRENATAL MULTIVITAMIN CH
1.0000 | ORAL_TABLET | Freq: Every day | ORAL | Status: DC
  Administered 2012-03-27 – 2012-03-28 (×2): 1 via ORAL
  Filled 2012-03-26 (×2): qty 1

## 2012-03-26 MED ORDER — OXYCODONE-ACETAMINOPHEN 5-325 MG PO TABS
1.0000 | ORAL_TABLET | ORAL | Status: DC | PRN
  Administered 2012-03-27 – 2012-03-28 (×9): 1 via ORAL
  Administered 2012-03-29 (×2): 2 via ORAL
  Filled 2012-03-26 (×5): qty 1
  Filled 2012-03-26 (×2): qty 2
  Filled 2012-03-26 (×4): qty 1

## 2012-03-26 MED ORDER — LACTATED RINGERS IV SOLN
INTRAVENOUS | Status: DC
  Administered 2012-03-26: 20:00:00 via INTRAVENOUS

## 2012-03-26 MED ORDER — ONDANSETRON HCL 4 MG/2ML IJ SOLN
INTRAMUSCULAR | Status: DC | PRN
  Administered 2012-03-26: 4 mg via INTRAVENOUS

## 2012-03-26 MED ORDER — DIPHENHYDRAMINE HCL 25 MG PO CAPS: 25.0000 mg | ORAL_CAPSULE | Freq: Four times a day (QID) | ORAL | Status: DC | PRN

## 2012-03-26 MED ORDER — IBUPROFEN 600 MG PO TABS: 600.0000 mg | ORAL_TABLET | Freq: Four times a day (QID) | ORAL | Status: DC | PRN

## 2012-03-26 MED ORDER — LACTATED RINGERS IV SOLN: INTRAVENOUS | Status: DC | PRN

## 2012-03-26 MED ORDER — ONDANSETRON HCL 4 MG/2ML IJ SOLN: 4.0000 mg | Freq: Three times a day (TID) | INTRAMUSCULAR | Status: DC | PRN

## 2012-03-26 MED ORDER — FLEET ENEMA 7-19 GM/118ML RE ENEM: 1.0000 | ENEMA | RECTAL | Status: DC | PRN

## 2012-03-26 MED ORDER — SCOPOLAMINE 1 MG/3DAYS TD PT72
MEDICATED_PATCH | TRANSDERMAL | Status: AC
  Filled 2012-03-26: qty 1

## 2012-03-26 MED ORDER — PHENYLEPHRINE HCL 10 MG/ML IJ SOLN
INTRAMUSCULAR | Status: DC | PRN
  Administered 2012-03-26: 80 ug via INTRAVENOUS
  Administered 2012-03-26 (×2): 40 ug via INTRAVENOUS
  Administered 2012-03-26 (×3): 80 ug via INTRAVENOUS

## 2012-03-26 MED ORDER — NALBUPHINE HCL 10 MG/ML IJ SOLN
5.0000 mg | INTRAMUSCULAR | Status: DC | PRN
  Filled 2012-03-26: qty 1

## 2012-03-26 MED ORDER — IBUPROFEN 600 MG PO TABS
600.0000 mg | ORAL_TABLET | Freq: Four times a day (QID) | ORAL | Status: DC
  Administered 2012-03-27 – 2012-03-29 (×9): 600 mg via ORAL
  Filled 2012-03-26 (×10): qty 1

## 2012-03-26 MED ORDER — ONDANSETRON HCL 4 MG/2ML IJ SOLN
4.0000 mg | INTRAMUSCULAR | Status: DC | PRN
  Filled 2012-03-26: qty 2

## 2012-03-26 MED ORDER — LANOLIN HYDROUS EX OINT: 1.0000 "application " | TOPICAL_OINTMENT | CUTANEOUS | Status: DC | PRN

## 2012-03-26 MED ORDER — METOCLOPRAMIDE HCL 5 MG/ML IJ SOLN: 10.0000 mg | Freq: Three times a day (TID) | INTRAMUSCULAR | Status: DC | PRN

## 2012-03-26 MED ORDER — KETOROLAC TROMETHAMINE 60 MG/2ML IM SOLN
60.0000 mg | Freq: Once | INTRAMUSCULAR | Status: AC | PRN
  Administered 2012-03-26: 60 mg via INTRAMUSCULAR

## 2012-03-26 MED ORDER — DIBUCAINE 1 % RE OINT: 1.0000 "application " | TOPICAL_OINTMENT | RECTAL | Status: DC | PRN

## 2012-03-26 MED ORDER — ONDANSETRON HCL 4 MG/2ML IJ SOLN: 4.0000 mg | Freq: Four times a day (QID) | INTRAMUSCULAR | Status: DC | PRN

## 2012-03-26 MED ORDER — KETOROLAC TROMETHAMINE 60 MG/2ML IM SOLN
INTRAMUSCULAR | Status: AC
  Administered 2012-03-26: 60 mg via INTRAMUSCULAR
  Filled 2012-03-26: qty 2

## 2012-03-26 MED ORDER — CHLOROPROCAINE HCL 3 % IJ SOLN
INTRAMUSCULAR | Status: AC
  Filled 2012-03-26: qty 20

## 2012-03-26 MED ORDER — OXYCODONE-ACETAMINOPHEN 5-325 MG PO TABS: 1.0000 | ORAL_TABLET | ORAL | Status: DC | PRN

## 2012-03-26 MED ORDER — WITCH HAZEL-GLYCERIN EX PADS: 1.0000 "application " | MEDICATED_PAD | CUTANEOUS | Status: DC | PRN

## 2012-03-26 MED ORDER — MEPERIDINE HCL 25 MG/ML IJ SOLN: 6.2500 mg | INTRAMUSCULAR | Status: DC | PRN

## 2012-03-26 MED ORDER — LACTATED RINGERS IV SOLN: 500.0000 mL | INTRAVENOUS | Status: DC | PRN

## 2012-03-26 MED ORDER — NALBUPHINE SYRINGE 5 MG/0.5 ML
INJECTION | INTRAMUSCULAR | Status: AC
  Filled 2012-03-26: qty 0.5

## 2012-03-26 MED ORDER — MORPHINE SULFATE 0.5 MG/ML IJ SOLN
INTRAMUSCULAR | Status: AC
  Filled 2012-03-26: qty 10

## 2012-03-26 SURGICAL SUPPLY — 23 items
CLOTH BEACON ORANGE TIMEOUT ST (SAFETY) ×2 IMPLANT
DERMABOND ADVANCED (GAUZE/BANDAGES/DRESSINGS) ×1
DERMABOND ADVANCED .7 DNX12 (GAUZE/BANDAGES/DRESSINGS) ×1 IMPLANT
ELECT REM PT RETURN 9FT ADLT (ELECTROSURGICAL) ×2
ELECTRODE REM PT RTRN 9FT ADLT (ELECTROSURGICAL) ×1 IMPLANT
GLOVE BIO SURGEON STRL SZ8.5 (GLOVE) ×4 IMPLANT
GOWN PREVENTION PLUS LG XLONG (DISPOSABLE) ×4 IMPLANT
GOWN PREVENTION PLUS XXLARGE (GOWN DISPOSABLE) ×2 IMPLANT
KIT ABG SYR 3ML LUER SLIP (SYRINGE) ×2 IMPLANT
NEEDLE HYPO 25X5/8 SAFETYGLIDE (NEEDLE) ×2 IMPLANT
NS IRRIG 1000ML POUR BTL (IV SOLUTION) ×2 IMPLANT
PACK C SECTION WH (CUSTOM PROCEDURE TRAY) ×2 IMPLANT
SLEEVE SCD COMPRESS KNEE MED (MISCELLANEOUS) ×2 IMPLANT
SUT CHROMIC 0 CT 802H (SUTURE) ×2 IMPLANT
SUT CHROMIC 1 CTX 36 (SUTURE) ×4 IMPLANT
SUT CHROMIC 2 0 SH (SUTURE) ×2 IMPLANT
SUT GUT PLAIN 0 CT-3 TAN 27 (SUTURE) ×2 IMPLANT
SUT MON AB 4-0 PS1 27 (SUTURE) ×2 IMPLANT
SUT VIC AB 0 CTX 36 (SUTURE) ×2
SUT VIC AB 0 CTX36XBRD ANBCTRL (SUTURE) ×2 IMPLANT
TOWEL OR 17X24 6PK STRL BLUE (TOWEL DISPOSABLE) ×4 IMPLANT
TRAY FOLEY CATH 14FR (SET/KITS/TRAYS/PACK) ×2 IMPLANT
WATER STERILE IRR 1000ML POUR (IV SOLUTION) ×2 IMPLANT

## 2012-03-26 NOTE — H&P (Signed)
This is Dr. Francoise Ceo dictating the history and physical on  Lindsay Hurley she's a 41 year old gravida 5 para 2022 who was last shot was 12 years ago she is now at 39+ weeks her [facility] 04/01/2012 she has been followed with maternal-fetal medicine because of polyhydramnios and she has had weekly ultrasounds and nonstress tests and MFM agrees that she should be brought in for delivery at this time she has been a persistent breech an ultrasound today showed breech presentation the patient also desired sterilization her screening one-hour glucose was abnormal and a three-hour GTT was all normal Past medical history shows a history of herpes and is now on Valtrex 500 by mouth daily she has not had any outbreaks in this pregnancy Past surgical history and a hysteroscopy and D&C in the past Social history negative System review noncontributory Physical exam revealed a well-developed female in no distress HEENT negative Lungs clear Breasts engorged Heart regular rhythm no murmurs no gallops Abdomen massively distended with polyhydramnios Pelvic cervix was 1 cm dilated Extremities negative Plan C-section and tubal ligation today

## 2012-03-26 NOTE — Op Note (Signed)
preop diagnosis 39+ weeks with polyhydramnios and breech presentation and multiparity postop diagnosis Postop diagnosis primary low transverse cesarean section and tubal ligation Surgeon Dr. Francoise Ceo First assistant Dr. Coral Ceo Anesthesia spinal Procedure after the spinal administered abdomen prepped and draped bladder emptied with a Foley catheter a transverse suprapubic incision made carried down to the rectus fascia fascia cleaned and incised the length of the incision the recti muscles retracted laterally and the peritoneum incised longitudinally a transverse lower units uterine incision made and then membranes ruptured and a large amount of fluid obtained as a double footling breech and breech extraction was done female Apgar 89 the placenta was posterior removed manually and sent to pathology the uterine incision the uterus was cleaned and a dry laps uterine incision closed in one layer with continuous looped abnormal one chromic hemostasis was satisfactory bladder flap reattached to a chromic the uterus was massively enlarged with intramural myoma the right tube was grasped in the midportion with a Babcock clamp 0 plain suture placed in the nasal salpinx below the portion of tube with the clamp this was tied in approximately 1 inch of tube was transected the procedure was done in a similar fashion the site blood loss was 800 cc abdomen chosen as peritoneum continuous with 2-0 chromic fascia continuous within of 0 Dexon and the skin shows a subcuticular stitch of 4-0 Monocryl patient tolerated the procedure well taken to recovery room in good condition thank you and

## 2012-03-26 NOTE — Transfer of Care (Signed)
Immediate Anesthesia Transfer of Care Note  Patient: Lindsay Hurley  Procedure(s) Performed: Procedure(s) (LRB): CESAREAN SECTION (N/A)  Patient Location: PACU  Anesthesia Type: Spinal  Level of Consciousness: awake, alert  and oriented  Airway & Oxygen Therapy: Patient Spontanous Breathing  Post-op Assessment: Report given to PACU RN and Post -op Vital signs reviewed and stable  Post vital signs: Reviewed and stable  Complications: No apparent anesthesia complications

## 2012-03-26 NOTE — Anesthesia Procedure Notes (Signed)
Epidural Patient location during procedure: OB  Preanesthetic Checklist Completed: patient identified, site marked, surgical consent, pre-op evaluation, timeout performed, IV checked, risks and benefits discussed and monitors and equipment checked  Epidural Patient position: sitting Prep: site prepped and draped and DuraPrep Patient monitoring: continuous pulse ox and blood pressure Approach: midline Injection technique: LOR air  Needle:  Needle type: Tuohy  Needle gauge: 17 G Needle length: 9 cm Needle insertion depth: 5 cm cm Catheter type: closed end flexible Catheter size: 19 Gauge Catheter at skin depth: 10 cm Test dose: negative  Assessment Events: blood not aspirated, injection not painful, no injection resistance, negative IV test and no paresthesia  Additional Notes Spinal Dosage in OR  Bupivicaine ml       1.3 PFMS04   mcg        150 Fentanyl mcg            25

## 2012-03-26 NOTE — Anesthesia Preprocedure Evaluation (Addendum)
Anesthesia Evaluation  Patient identified by MRN, date of birth, ID band Patient awake    Reviewed: Allergy & Precautions, H&P , Patient's Chart, lab work & pertinent test results  Airway Mallampati: IV TM Distance: >3 FB Neck ROM: full    Dental No notable dental hx.    Pulmonary  breath sounds clear to auscultation  Pulmonary exam normal       Cardiovascular Exercise Tolerance: Good Rhythm:regular Rate:Normal     Neuro/Psych    GI/Hepatic   Endo/Other  Morbid obesity  Renal/GU      Musculoskeletal   Abdominal   Peds  Hematology   Anesthesia Other Findings   Reproductive/Obstetrics                          Anesthesia Physical Anesthesia Plan  ASA: III  Anesthesia Plan: Spinal   Post-op Pain Management:    Induction:   Airway Management Planned:   Additional Equipment:   Intra-op Plan:   Post-operative Plan:   Informed Consent: I have reviewed the patients History and Physical, chart, labs and discussed the procedure including the risks, benefits and alternatives for the proposed anesthesia with the patient or authorized representative who has indicated his/her understanding and acceptance.   Dental Advisory Given  Plan Discussed with: CRNA  Anesthesia Plan Comments: (Lab work confirmed with CRNA in room. Platelets okay. Discussed spinal anesthetic, and patient consents to the procedure:  included risk of possible headache,backache, failed block, allergic reaction, and nerve injury. This patient was asked if she had any questions or concerns before the procedure started. )       Anesthesia Quick Evaluation

## 2012-03-27 ENCOUNTER — Encounter (HOSPITAL_COMMUNITY): Payer: Self-pay | Admitting: Obstetrics

## 2012-03-27 LAB — CBC
MCH: 26.1 pg (ref 26.0–34.0)
MCHC: 31 g/dL (ref 30.0–36.0)
MCV: 84.2 fL (ref 78.0–100.0)
Platelets: 127 10*3/uL — ABNORMAL LOW (ref 150–400)
RDW: 16.2 % — ABNORMAL HIGH (ref 11.5–15.5)

## 2012-03-27 NOTE — Progress Notes (Signed)
Patient ID: Lindsay Hurley, female   DOB: Feb 03, 1971, 41 y.o.   MRN: [id number] Postop day 1 Vital signs normal Fundus firm Legs negative Explained to patient about her large myoma and baby weighed 8 lbs. 3 oz. At birth

## 2012-03-27 NOTE — Anesthesia Postprocedure Evaluation (Signed)
  Anesthesia Post Note  Patient: Lindsay Hurley  Procedure(s) Performed: Procedure(s) (LRB): CESAREAN SECTION (N/A)  Anesthesia type: Spinal  Patient location: Mother/Baby  Post pain: Pain level controlled  Post assessment: Post-op Vital signs reviewed  Last Vitals:  Filed Vitals:   03/27/12 0800  BP: 106/67  Pulse: 90  Temp: 36.8 C  Resp: 20    Post vital signs: Reviewed  Level of consciousness: awake  Complications: No apparent anesthesia complications

## 2012-03-28 ENCOUNTER — Other Ambulatory Visit (HOSPITAL_COMMUNITY): Payer: BC Managed Care – PPO

## 2012-03-28 ENCOUNTER — Ambulatory Visit (HOSPITAL_COMMUNITY): Payer: BC Managed Care – PPO

## 2012-03-28 NOTE — Progress Notes (Signed)
Patient ID: Lindsay Hurley, female   DOB: June 24, 1971, 41 y.o.   MRN: [id number] Postop day 2 Fundus firm lochia moderate Incision clean and dry Legs negative Vital signs normal no complaints

## 2012-03-29 NOTE — Discharge Summary (Signed)
Obstetric Discharge Summary Reason for Admission: induction of labor Prenatal Procedures: NST Intrapartum Procedures: cesarean: low cervical, transverse Postpartum Procedures: none Complications-Operative and Postpartum: none Hemoglobin  Date Value Range Status  03/27/2012 7.4* 12.0-15.0 (g/dL) Final     REPEATED TO VERIFY     DELTA CHECK NOTED     HCT  Date Value Range Status  03/27/2012 23.9* 36.0-46.0 (%) Final    Physical Exam:  General: alert Lochia: appropriate Uterine Fundus: firm Incision: healing well DVT Evaluation: No evidence of DVT seen on physical exam.  Discharge Diagnoses: Term Pregnancy-delivered  Discharge Information: Date: 03/29/2012 Activity: pelvic rest Diet: routine Medications: Percocet Condition: stable Instructions: refer to practice specific booklet Discharge to: home Follow-up Information    Follow up with Journee Bobrowski A, MD. Call in 6 weeks.   Contact information:   [address] [place] [address] [phone]          Newborn Data: Live born female  Birth Weight: 8 lb 3.4 oz (3725 g) APGAR: 4, 9  Home with mother.  Jasmeet Gehl A 03/29/2012, 7:12 AM

## 2012-03-29 NOTE — Discharge Instructions (Signed)
Discharge instructions   You can wash your hair  Shower  Eat what you want  Drink what you want  See me in 6 weeks  Your ankles are going to swell more in the next 2 weeks than when pregnant  No sex for 6 weeks   Gael Londo A, MD 03/29/2012    

## 2012-10-04 ENCOUNTER — Inpatient Hospital Stay (HOSPITAL_COMMUNITY)
Admission: AD | Admit: 2012-10-04 | Discharge: 2012-10-05 | Disposition: A | Discharge status: Home / Self Care | Payer: BC Managed Care – PPO | Source: Ambulatory Visit | Attending: Obstetrics | Admitting: Obstetrics

## 2012-10-04 DIAGNOSIS — K8020 Calculus of gallbladder without cholecystitis without obstruction: Secondary | ICD-10-CM | POA: Insufficient documentation

## 2012-10-04 DIAGNOSIS — R1011 Right upper quadrant pain: Secondary | ICD-10-CM | POA: Insufficient documentation

## 2012-10-05 ENCOUNTER — Encounter (HOSPITAL_COMMUNITY): Payer: Self-pay | Admitting: *Deleted

## 2012-10-05 ENCOUNTER — Inpatient Hospital Stay (HOSPITAL_COMMUNITY): Payer: BC Managed Care – PPO

## 2012-10-05 DIAGNOSIS — K8020 Calculus of gallbladder without cholecystitis without obstruction: Secondary | ICD-10-CM

## 2012-10-05 LAB — URINALYSIS, ROUTINE W REFLEX MICROSCOPIC
Bilirubin Urine: NEGATIVE
Ketones, ur: NEGATIVE mg/dL
Nitrite: NEGATIVE
Protein, ur: NEGATIVE mg/dL
Urobilinogen, UA: 0.2 mg/dL (ref 0.0–1.0)
pH: 6 (ref 5.0–8.0)

## 2012-10-05 LAB — CBC
MCH: 27.8 pg (ref 26.0–34.0)
MCHC: 32.7 g/dL (ref 30.0–36.0)
MCV: 85.1 fL (ref 78.0–100.0)
Platelets: 207 10*3/uL (ref 150–400)
RBC: 3.95 MIL/uL (ref 3.87–5.11)

## 2012-10-05 LAB — COMPREHENSIVE METABOLIC PANEL
AST: 12 U/L (ref 0–37)
CO2: 28 mEq/L (ref 19–32)
Calcium: 9.3 mg/dL (ref 8.4–10.5)
Creatinine, Ser: 0.82 mg/dL (ref 0.50–1.10)
GFR calc Af Amer: >90 mL/min (ref >90)
GFR calc non Af Amer: 89 mL/min — ABNORMAL LOW (ref >90)
Total Protein: 6.7 g/dL (ref 6.0–8.3)

## 2012-10-05 MED ORDER — OXYCODONE-ACETAMINOPHEN 5-325 MG PO TABS: 1.0000 | ORAL_TABLET | ORAL | Status: DC | PRN

## 2012-10-05 MED ORDER — OXYCODONE-ACETAMINOPHEN 5-325 MG PO TABS
1.0000 | ORAL_TABLET | Freq: Once | ORAL | Status: AC
  Administered 2012-10-05: 1 via ORAL
  Filled 2012-10-05: qty 1

## 2012-10-05 NOTE — MAU Note (Signed)
Having pain in upper stomach that comes and goes. Had it off and on all wk. Some mid back pain that is sharp and constant. R wrist pain that I've had awhile, just wanted to get checked out

## 2012-10-05 NOTE — Progress Notes (Signed)
Lindsay Hurley CNM notified of pt's u/s report being finished. Will see pt

## 2012-10-05 NOTE — Progress Notes (Signed)
Zorita Pang CNM in earlier to discuss u/s and lab results and d/c plan. Written and verbal d/c instructions given and understanding voiced.

## 2012-10-05 NOTE — MAU Provider Note (Signed)
History     CSN: [id number]  Arrival date and time: 10/04/12 2352   First Provider Initiated Contact with Patient 10/05/12 0032      Chief Complaint  Patient presents with  . Abdominal Pain  . Back Pain   HPI  Lindsay Hurley is a 41 y.o. who presents today with RUQ pain that radiates to her back. She had pizza and brownies for dinner tonight. She last ate 2000. She took 800 mg ibuprofen prior to arrival, and it did not help.   Past Medical History  Diagnosis Date  . Anemia   . Herpes genitalia     Past Surgical History  Procedure Date  . Cervical polypectomy   . Hysteroscopy   . Cesarean section 03/26/2012    Procedure: CESAREAN SECTION;  Surgeon: Kathreen Cosier, MD;  Location: [facility];  Service: Gynecology;  Laterality: N/A;  . Tubal ligation     Family History  Problem Relation Age of Onset  . Anesthesia problems Neg Hx   . Asthma Brother   . Diabetes Maternal Aunt   . Stroke Maternal Aunt   . Alcohol abuse Maternal Uncle   . Drug abuse Maternal Uncle   . Alcohol abuse Paternal Uncle   . Drug abuse Paternal Uncle   . Cancer Maternal Grandmother   . COPD Maternal Grandfather   . Depression Cousin     History  Substance Use Topics  . Smoking status: Never Smoker   . Smokeless tobacco: Never Used  . Alcohol Use: No    Allergies:  Allergies  Allergen Reactions  . Amoxicillin Hives  . Penicillins Hives    Prescriptions prior to admission  Medication Sig Dispense Refill  . ibuprofen (ADVIL,MOTRIN) 800 MG tablet Take 800 mg by mouth every 8 (eight) hours as needed.        Review of Systems  Constitutional: Negative for fever and chills.  Gastrointestinal: Positive for vomiting and abdominal pain. Negative for diarrhea and constipation.  Genitourinary: Negative for dysuria, urgency and frequency.  Musculoskeletal: Negative for myalgias.   Physical Exam   Blood pressure 122/74, pulse 65, temperature 98 F (36.7 C), resp. rate 20, height 5\' 4"   (1.626 m), weight 97.886 kg (215 lb 12.8 oz), last menstrual period 09/06/2012, unknown if currently breastfeeding.  Physical Exam  Nursing note and vitals reviewed. Constitutional: She is oriented to person, place, and time. She appears well-developed and well-nourished. No distress.  Cardiovascular: Normal rate.   Respiratory: Effort normal.  GI: Soft. She exhibits no distension and no mass. There is tenderness (in the RUQ). There is no rebound and no guarding.  Neurological: She is alert and oriented to person, place, and time.  Skin: Skin is warm and dry.  Psychiatric: She has a normal mood and affect.    MAU Course  Procedures  *RADIOLOGY REPORT*  Clinical Data: Right upper quadrant pain.  LIMITED ABDOMINAL ULTRASOUND - RIGHT UPPER QUADRANT  Comparison: None.  Findings:  Gallbladder: The gallbladder is distended and contains multiple  stones in the dependent portion. No gallbladder wall thickening or  pericholecystic edema. Murphy's sign is negative.  Common bile duct: Normal caliber with measured diameter of 4.5 mm.  Liver: Normal homogeneous parenchymal echotexture. No focal mass  lesions demonstrated. Color flow Doppler images of the main portal  vein show flow in the appropriate direction.  IMPRESSION:  Cholelithiasis.   Assessment and Plan   1. Cholelithiases      Medication List     As  of 10/05/2012  3:49 AM    START taking these medications         oxyCODONE-acetaminophen 5-325 MG per tablet   Commonly known as: PERCOCET/ROXICET   Take 1 tablet by mouth every 4 (four) hours as needed for pain.      CONTINUE taking these medications         ibuprofen 800 MG tablet   Commonly known as: ADVIL,MOTRIN          Where to get your medications    These are the prescriptions that you need to pick up.   You may get these medications from any pharmacy.         oxyCODONE-acetaminophen 5-325 MG per tablet           FU with Dr. Gaynell Face for surgical  referral Low fat diet  Tawnya Crook 10/05/2012, 12:33 AM

## 2012-11-17 ENCOUNTER — Encounter (HOSPITAL_COMMUNITY): Payer: Self-pay | Admitting: *Deleted

## 2012-11-17 ENCOUNTER — Emergency Department (HOSPITAL_COMMUNITY)
Admission: EM | Admit: 2012-11-17 | Discharge: 2012-11-17 | Disposition: A | Discharge status: Home / Self Care | Payer: BC Managed Care – PPO | Attending: Emergency Medicine | Admitting: Emergency Medicine

## 2012-11-17 DIAGNOSIS — Z862 Personal history of diseases of the blood and blood-forming organs and certain disorders involving the immune mechanism: Secondary | ICD-10-CM | POA: Insufficient documentation

## 2012-11-17 DIAGNOSIS — K8050 Calculus of bile duct without cholangitis or cholecystitis without obstruction: Secondary | ICD-10-CM

## 2012-11-17 DIAGNOSIS — Z3202 Encounter for pregnancy test, result negative: Secondary | ICD-10-CM | POA: Insufficient documentation

## 2012-11-17 DIAGNOSIS — R748 Abnormal levels of other serum enzymes: Secondary | ICD-10-CM | POA: Insufficient documentation

## 2012-11-17 DIAGNOSIS — K8020 Calculus of gallbladder without cholecystitis without obstruction: Secondary | ICD-10-CM | POA: Insufficient documentation

## 2012-11-17 DIAGNOSIS — E669 Obesity, unspecified: Secondary | ICD-10-CM | POA: Insufficient documentation

## 2012-11-17 DIAGNOSIS — A6000 Herpesviral infection of urogenital system, unspecified: Secondary | ICD-10-CM | POA: Insufficient documentation

## 2012-11-17 DIAGNOSIS — R7989 Other specified abnormal findings of blood chemistry: Secondary | ICD-10-CM

## 2012-11-17 LAB — COMPREHENSIVE METABOLIC PANEL
ALT: 399 U/L — ABNORMAL HIGH (ref 0–35)
AST: 684 U/L — ABNORMAL HIGH (ref 0–37)
Albumin: 4 g/dL (ref 3.5–5.2)
Alkaline Phosphatase: 165 U/L — ABNORMAL HIGH (ref 39–117)
BUN: 11 mg/dL (ref 6–23)
CO2: 24 mEq/L (ref 19–32)
Calcium: 9.5 mg/dL (ref 8.4–10.5)
Chloride: 102 mEq/L (ref 96–112)
Creatinine, Ser: 0.74 mg/dL (ref 0.50–1.10)
GFR calc Af Amer: >90 mL/min (ref >90)
GFR calc non Af Amer: >90 mL/min (ref >90)
Glucose, Bld: 117 mg/dL — ABNORMAL HIGH (ref 70–99)
Potassium: 3.9 mEq/L (ref 3.5–5.1)
Sodium: 136 mEq/L (ref 135–145)
Total Bilirubin: 1.2 mg/dL (ref 0.3–1.2)
Total Protein: 7.7 g/dL (ref 6.0–8.3)

## 2012-11-17 LAB — CBC WITH DIFFERENTIAL/PLATELET
Basophils Absolute: 0 10*3/uL (ref 0.0–0.1)
Basophils Relative: 0 % (ref 0–1)
Eosinophils Absolute: 0 10*3/uL (ref 0.0–0.7)
Eosinophils Relative: 0 % (ref 0–5)
HCT: 38.5 % (ref 36.0–46.0)
Hemoglobin: 12.4 g/dL (ref 12.0–15.0)
Lymphocytes Relative: 8 % — ABNORMAL LOW (ref 12–46)
Lymphs Abs: 0.8 10*3/uL (ref 0.7–4.0)
MCH: 27.8 pg (ref 26.0–34.0)
MCHC: 32.2 g/dL (ref 30.0–36.0)
MCV: 86.3 fL (ref 78.0–100.0)
Monocytes Absolute: 0.4 10*3/uL (ref 0.1–1.0)
Monocytes Relative: 4 % (ref 3–12)
Neutro Abs: 8.7 10*3/uL — ABNORMAL HIGH (ref 1.7–7.7)
Neutrophils Relative %: 87 % — ABNORMAL HIGH (ref 43–77)
Platelets: 292 10*3/uL (ref 150–400)
RBC: 4.46 MIL/uL (ref 3.87–5.11)
RDW: 13.4 % (ref 11.5–15.5)
WBC: 10 10*3/uL (ref 4.0–10.5)

## 2012-11-17 LAB — LIPASE, BLOOD: Lipase: 21 U/L (ref 11–59)

## 2012-11-17 MED ORDER — SODIUM CHLORIDE 0.9 % IV BOLUS (SEPSIS)
1000.0000 mL | Freq: Once | INTRAVENOUS | Status: AC
  Administered 2012-11-17: 1000 mL via INTRAVENOUS

## 2012-11-17 MED ORDER — HYDROMORPHONE HCL PF 1 MG/ML IJ SOLN
1.0000 mg | Freq: Once | INTRAMUSCULAR | Status: AC
  Administered 2012-11-17: 1 mg via INTRAVENOUS
  Filled 2012-11-17: qty 1

## 2012-11-17 MED ORDER — KETOROLAC TROMETHAMINE 15 MG/ML IJ SOLN
15.0000 mg | Freq: Once | INTRAMUSCULAR | Status: AC
  Administered 2012-11-17: 15 mg via INTRAVENOUS
  Filled 2012-11-17: qty 1

## 2012-11-17 MED ORDER — HYDROCODONE-ACETAMINOPHEN 5-325 MG PO TABS: 1.0000 | ORAL_TABLET | ORAL | Status: DC | PRN

## 2012-11-17 MED ORDER — ONDANSETRON HCL 4 MG/2ML IJ SOLN
4.0000 mg | Freq: Once | INTRAMUSCULAR | Status: AC
  Administered 2012-11-17: 4 mg via INTRAVENOUS
  Filled 2012-11-17: qty 2

## 2012-11-17 NOTE — ED Notes (Signed)
Reports referred by OB-GYN to see Dr. Lindie Spruce for consultation scheduled on Feb. 11th # 1500hr. This am around 700am called Dr. Jimmye Norman, surgeon due being awaken around 3:00am with RUQ pain,chest,  radiating to back & instructed to come to the ED & have staff call him. No orders with patient.

## 2012-11-17 NOTE — ED Notes (Signed)
Pt told last month she has gall stones, has appointment with surgeon, having pain this am

## 2012-11-18 ENCOUNTER — Ambulatory Visit (INDEPENDENT_AMBULATORY_CARE_PROVIDER_SITE_OTHER): Payer: BC Managed Care – PPO | Admitting: General Surgery

## 2012-11-18 ENCOUNTER — Encounter (INDEPENDENT_AMBULATORY_CARE_PROVIDER_SITE_OTHER): Payer: Self-pay | Admitting: General Surgery

## 2012-11-18 VITALS — BP 124/76 | HR 64 | Temp 96.7°F | Resp 16 | Ht 64.0 in | Wt 207.0 lb

## 2012-11-18 DIAGNOSIS — K8020 Calculus of gallbladder without cholecystitis without obstruction: Secondary | ICD-10-CM

## 2012-11-18 LAB — POCT PREGNANCY, URINE: Preg Test, Ur: NEGATIVE

## 2012-11-18 MED ORDER — HYDROCODONE-ACETAMINOPHEN 10-325 MG PO TABS: 1.0000 | ORAL_TABLET | Freq: Every day | ORAL | Status: DC

## 2012-11-18 NOTE — Progress Notes (Signed)
Patient ID: Lindsay Hurley, female   DOB: 11/29/1970, 41 y.o.   MRN: [id number]  Chief Complaint  Patient presents with  . New Evaluation    eval gallstones    HPI Lindsay Hurley is a 41 y.o. female.  The patient's benign-year-old female who was recently seen in the ED for right upper quadrant pain. He states she's had pain prior to her pregnancy approximately 9-12 months ago.  The patient has been epigastric right upper quadrant pain associated with nausea and vomiting after meals, mainly high fatty meals. HPI  Past Medical History  Diagnosis Date  . Anemia   . Herpes genitalia     Past Surgical History  Procedure Date  . Cervical polypectomy   . Hysteroscopy   . Tubal ligation   . Cesarean section 03/26/2012    Procedure: CESAREAN SECTION;  Surgeon: Bernard A Marshall, MD;  Location: [facility];  Service: Gynecology;  Laterality: N/A;    Family History  Problem Relation Age of Onset  . Anesthesia problems Neg Hx   . Asthma Brother   . Diabetes Maternal Aunt   . Stroke Maternal Aunt   . Alcohol abuse Maternal Uncle   . Drug abuse Maternal Uncle   . Cancer Maternal Uncle   . Alcohol abuse Paternal Uncle   . Drug abuse Paternal Uncle   . Cancer Maternal Grandmother     Lung  . COPD Maternal Grandfather   . Depression Cousin   . Cancer Father     Throat    Social History History  Substance Use Topics  . Smoking status: Never Smoker   . Smokeless tobacco: Never Used  . Alcohol Use: No    Allergies  Allergen Reactions  . Penicillins Hives    Current Outpatient Prescriptions  Medication Sig Dispense Refill  . acetaminophen-codeine (TYLENOL #3) 300-30 MG per tablet Take 1 tablet by mouth every 4 (four) hours as needed. For pain      . HYDROcodone-acetaminophen (NORCO/VICODIN) 5-325 MG per tablet Take 1-2 tablets by mouth every 4 (four) hours as needed for pain.  20 tablet  0  . ibuprofen (ADVIL,MOTRIN) 200 MG tablet Take 200 mg by mouth every 6 (six) hours as  needed.        Review of Systems Review of Systems  Constitutional: Negative.   HENT: Negative.   Respiratory: Negative.   Cardiovascular: Negative.   Gastrointestinal: Positive for nausea, vomiting and abdominal pain.  Musculoskeletal: Negative.   Neurological: Negative.     Blood pressure 124/76, pulse 64, temperature 96.7 F (35.9 C), temperature source Temporal, resp. rate 16, height 5' 4" (1.626 m), weight 207 lb (93.895 kg), last menstrual period 11/16/2012.  Physical Exam Physical Exam  Constitutional: She is oriented to person, place, and time. She appears well-developed and well-nourished.  HENT:  Head: Normocephalic and atraumatic.  Eyes: Conjunctivae normal and EOM are normal. Pupils are equal, round, and reactive to light.  Neck: Normal range of motion. Neck supple.  Cardiovascular: Normal rate, regular rhythm and normal heart sounds.   Pulmonary/Chest: Effort normal.  Abdominal: Soft. Bowel sounds are normal. She exhibits no distension and no mass. There is tenderness (ruq). There is no rebound and no guarding.  Musculoskeletal: Normal range of motion.  Neurological: She is alert and oriented to person, place, and time.    Data Reviewed Ultrasound which revealed gallstones. Her LFTs including her AST /ALT have been elevated as well as alkaline phosphatase. Her T. bili 1.2  Assessment      41-year-old female right upper quadrant pain and symptomatic cholelithiasis    Plan    1. We'll proceed to the operating room for laparoscopic cholecystectomy with intraoperative Cholangiogram..  2.All risks and benefits were discussed with the patient, to generally include infection, bleeding, damage to surrounding structures, and recurrence. Alternatives were offered and described.  All questions were answered and the patient voiced understanding of the procedure and wishes to proceed at this point.        Lindsay Schneck Jr., Lindsay Hurley 11/18/2012, 3:58 PM    

## 2012-11-20 ENCOUNTER — Other Ambulatory Visit (HOSPITAL_COMMUNITY): Payer: Self-pay | Admitting: General Surgery

## 2012-11-20 ENCOUNTER — Encounter (HOSPITAL_COMMUNITY): Payer: Self-pay | Admitting: Pharmacy Technician

## 2012-11-20 NOTE — Patient Instructions (Addendum)
20 ALEXANDR OEHLER  11/20/2012   Your procedure is scheduled on: 11-29-12  Report to [facility] at 1130 AM.  Call this number if you have problems the morning of surgery [phone]   Remember:   Do not eat food  :After Midnight.   clear liquids midnight until 0800 am, then nothing by mouth   Take these medicines the morning of surgery with A SIP OF WATER: pain medication if needed                                SEE [facility] PREPARING FOR SURGERY SHEET   Do not wear jewelry, make-up or nail polish.  Do not wear lotions, powders, or perfumes. You may wear deodorant.   Men may shave face and neck.  Do not bring valuables to the hospital.  Contacts, dentures or bridgework may not be worn into surgery.     Patients discharged the day of surgery will not be allowed to drive home.  Name and phone number of your driver: Everlean Alstrom [phone]     Please read over the following fact sheets that you were given: MRSA Information.  Call Birdie Sons RN pre op nurse if needed [phone]    FAILURE TO FOLLOW THESE INSTRUCTIONS MAY RESULT IN THE CANCELLATION OF YOUR SURGERY. PATIENT SIGNATURE___________________________________________

## 2012-11-21 ENCOUNTER — Encounter (HOSPITAL_COMMUNITY)
Admission: RE | Admit: 2012-11-21 | Discharge: 2012-11-21 | Disposition: A | Discharge status: Home / Self Care | Payer: BC Managed Care – PPO | Source: Ambulatory Visit | Attending: General Surgery | Admitting: General Surgery

## 2012-11-21 ENCOUNTER — Encounter (HOSPITAL_COMMUNITY): Payer: Self-pay

## 2012-11-21 LAB — COMPREHENSIVE METABOLIC PANEL
AST: 101 U/L — ABNORMAL HIGH (ref 0–37)
Albumin: 3.5 g/dL (ref 3.5–5.2)
Chloride: 99 mEq/L (ref 96–112)
Creatinine, Ser: 0.82 mg/dL (ref 0.50–1.10)
Potassium: 3.7 mEq/L (ref 3.5–5.1)
Total Bilirubin: 4.8 mg/dL — ABNORMAL HIGH (ref 0.3–1.2)

## 2012-11-21 LAB — SURGICAL PCR SCREEN: Staphylococcus aureus: NEGATIVE

## 2012-11-21 NOTE — Progress Notes (Signed)
Urine pregnancy test 11/17/12 on EPIC, CBC 11/17/12 on EPIC, CMET 11/17/12 on EPIC-abnormal and will repeat.

## 2012-11-26 NOTE — ED Provider Notes (Signed)
History    42 year old female with abdominal pain. Right upper quadrant with radiation to her back. Patient has been having intermittent similar pain for about a month. Had an outpatient ultrasound which showed cholelithiasis. She has appointment scheduled with Gen. surgery on February 11. Patient's pain became acutely worse at around 3:00 this morning. Intermittent. Exacerbating w eating. Has been taking Tylenol with codeine which has previously relieved the pain but did not today which is why she came to the emergency room.  CSN: [id number]  Arrival date & time 11/17/12  [id number]   First MD Initiated Contact with Patient 11/17/12 [phone]      Chief Complaint  Patient presents with  . Abdominal Pain    (Consider location/radiation/quality/duration/timing/severity/associated sxs/prior treatment) HPI  Past Medical History  Diagnosis Date  . Anemia   . Herpes genitalia     Past Surgical History  Procedure Date  . Cervical polypectomy   . Hysteroscopy   . Tubal ligation   . Cesarean section 03/26/2012    Procedure: CESAREAN SECTION;  Surgeon: Kathreen Cosier, MD;  Location: [facility];  Service: Gynecology;  Laterality: N/A;    Family History  Problem Relation Age of Onset  . Anesthesia problems Neg Hx   . Asthma Brother   . Diabetes Maternal Aunt   . Stroke Maternal Aunt   . Alcohol abuse Maternal Uncle   . Drug abuse Maternal Uncle   . Cancer Maternal Uncle   . Alcohol abuse Paternal Uncle   . Drug abuse Paternal Uncle   . Cancer Maternal Grandmother     Lung  . COPD Maternal Grandfather   . Depression Cousin   . Cancer Father     Throat    History  Substance Use Topics  . Smoking status: Never Smoker   . Smokeless tobacco: Never Used  . Alcohol Use: No    OB History    Grav Para Term Preterm Abortions TAB SAB Ect Mult Living   5 3 3  0 2 1 1  0 0 3      Review of Systems  Allergies  Penicillins  Home Medications   Current Outpatient Rx  Name  Route  Sig   Dispense  Refill  . ACETAMINOPHEN-CODEINE #3 300-30 MG PO TABS   Oral   Take 1 tablet by mouth every 4 (four) hours as needed. For pain         . HYDROCODONE-ACETAMINOPHEN 5-325 MG PO TABS   Oral   Take 1-2 tablets by mouth every 4 (four) hours as needed for pain.   20 tablet   0   . IBUPROFEN 200 MG PO TABS   Oral   Take 200 mg by mouth every 6 (six) hours as needed. Pain           BP 98/47  Pulse 64  Temp 97.9 F (36.6 C) (Oral)  Resp 16  SpO2 100%  LMP 11/16/2012  Breastfeeding? No  Physical Exam  Nursing note and vitals reviewed. Constitutional: She appears well-developed and well-nourished. No distress.       Laying in bed. No acute distress. Obese.  HENT:  Head: Normocephalic and atraumatic.  Eyes: Conjunctivae normal are normal. Right eye exhibits no discharge. Left eye exhibits no discharge.  Neck: Neck supple.  Cardiovascular: Normal rate, regular rhythm and normal heart sounds.  Exam reveals no gallop and no friction rub.   No murmur heard. Pulmonary/Chest: Effort normal and breath sounds normal. No respiratory distress.  Abdominal: Soft. She exhibits  no distension. There is tenderness.       Tenderness the right upper quadrant without rebound or guarding. No distention.  Genitourinary:       No CVA tenderness  Musculoskeletal: She exhibits no edema and no tenderness.  Neurological: She is alert.  Skin: Skin is warm and dry.  Psychiatric: She has a normal mood and affect. Her behavior is normal. Thought content normal.    ED Course  Procedures (including critical care time)  Labs Reviewed  CBC WITH DIFFERENTIAL - Abnormal; Notable for the following:    Neutrophils Relative 87 (*)     Neutro Abs 8.7 (*)     Lymphocytes Relative 8 (*)     All other components within normal limits  COMPREHENSIVE METABOLIC PANEL - Abnormal; Notable for the following:    Glucose, Bld 117 (*)     AST 684 (*)     ALT 399 (*)     Alkaline Phosphatase 165 (*)     All  other components within normal limits  LIPASE, BLOOD  LAB REPORT - SCANNED  POCT PREGNANCY, URINE   No results found.   1. Biliary colic   2. Elevated LFTs       MDM  42 year-old female with abdominal pain. Suspect biliary colic. Patient with recent ultrasound which showed cholelithiasis. Patient's abdominal pain has completely resolved after pain medication and repeat abdominal exam prior to discharge shows just minimal tenderness. Her labs today did show an increase in her LFTs which is a change from prior labs. Total bilirubin is normal. Lipase is normal. She is afebrile does not have a leukocytosis. I would not expect complete resolution of her pain even with pain medicines is she had cholecystitis. Patient was previously scheduled to see Dr. Lindie Spruce, surgery. I discussed with the on-call surgeon today he recommended that patient call the office to be seen sooner. This is discussed with patient and her significant other. Hearing agreement with the plan. Emergent return precautions were discussed. Patient is being discharged with a prescription for Percocet.        Raeford Razor, MD 11/26/12 [phone]

## 2012-11-27 NOTE — Progress Notes (Signed)
Notified patient of surgery change time, to be at Short Stay at 0730am, nothing by mouth after midnight other than any meds as directed with sip water

## 2012-11-29 ENCOUNTER — Encounter (HOSPITAL_COMMUNITY): Payer: Self-pay | Admitting: *Deleted

## 2012-11-29 ENCOUNTER — Ambulatory Visit (HOSPITAL_COMMUNITY): Payer: BC Managed Care – PPO | Admitting: Anesthesiology

## 2012-11-29 ENCOUNTER — Ambulatory Visit (HOSPITAL_COMMUNITY): Payer: BC Managed Care – PPO

## 2012-11-29 ENCOUNTER — Encounter (HOSPITAL_COMMUNITY)
Admission: RE | Disposition: A | Discharge status: Home / Self Care | Payer: Self-pay | Source: Ambulatory Visit | Attending: General Surgery

## 2012-11-29 ENCOUNTER — Encounter (HOSPITAL_COMMUNITY): Payer: Self-pay | Admitting: Anesthesiology

## 2012-11-29 ENCOUNTER — Observation Stay (HOSPITAL_COMMUNITY)
Admission: RE | Admit: 2012-11-29 | Discharge: 2012-11-30 | Disposition: A | Discharge status: Home / Self Care | Payer: BC Managed Care – PPO | Source: Ambulatory Visit | Attending: General Surgery | Admitting: General Surgery

## 2012-11-29 DIAGNOSIS — R7402 Elevation of levels of lactic acid dehydrogenase (LDH): Secondary | ICD-10-CM

## 2012-11-29 DIAGNOSIS — R932 Abnormal findings on diagnostic imaging of liver and biliary tract: Secondary | ICD-10-CM

## 2012-11-29 DIAGNOSIS — K8050 Calculus of bile duct without cholangitis or cholecystitis without obstruction: Secondary | ICD-10-CM

## 2012-11-29 DIAGNOSIS — K8060 Calculus of gallbladder and bile duct with cholecystitis, unspecified, without obstruction: Principal | ICD-10-CM | POA: Insufficient documentation

## 2012-11-29 DIAGNOSIS — K8064 Calculus of gallbladder and bile duct with chronic cholecystitis without obstruction: Secondary | ICD-10-CM | POA: Insufficient documentation

## 2012-11-29 DIAGNOSIS — Z01812 Encounter for preprocedural laboratory examination: Secondary | ICD-10-CM | POA: Insufficient documentation

## 2012-11-29 DIAGNOSIS — K8010 Calculus of gallbladder with chronic cholecystitis without obstruction: Secondary | ICD-10-CM

## 2012-11-29 HISTORY — PX: ERCP: SHX5425

## 2012-11-29 HISTORY — PX: CHOLECYSTECTOMY: SHX55

## 2012-11-29 LAB — PROTIME-INR
INR: 0.96 (ref 0.00–1.49)
Prothrombin Time: 12.7 seconds (ref 11.6–15.2)

## 2012-11-29 SURGERY — LAPAROSCOPIC CHOLECYSTECTOMY WITH INTRAOPERATIVE CHOLANGIOGRAM
Anesthesia: General | Wound class: Clean

## 2012-11-29 SURGERY — ERCP, WITH INTERVENTION IF INDICATED
Anesthesia: Moderate Sedation

## 2012-11-29 MED ORDER — HYDROMORPHONE HCL PF 1 MG/ML IJ SOLN
INTRAMUSCULAR | Status: DC | PRN
  Administered 2012-11-29 (×2): 0.5 mg via INTRAVENOUS

## 2012-11-29 MED ORDER — DEXTROSE IN LACTATED RINGERS 5 % IV SOLN
INTRAVENOUS | Status: DC
  Administered 2012-11-29: 100 mL/h via INTRAVENOUS

## 2012-11-29 MED ORDER — FENTANYL CITRATE 0.05 MG/ML IJ SOLN
INTRAMUSCULAR | Status: AC
  Filled 2012-11-29: qty 4

## 2012-11-29 MED ORDER — METRONIDAZOLE IN NACL 5-0.79 MG/ML-% IV SOLN
INTRAVENOUS | Status: AC
  Filled 2012-11-29: qty 100

## 2012-11-29 MED ORDER — NEOSTIGMINE METHYLSULFATE 1 MG/ML IJ SOLN
INTRAMUSCULAR | Status: DC | PRN
  Administered 2012-11-29: 4 mg via INTRAVENOUS

## 2012-11-29 MED ORDER — HYDROMORPHONE HCL PF 1 MG/ML IJ SOLN
INTRAMUSCULAR | Status: AC
  Filled 2012-11-29: qty 1

## 2012-11-29 MED ORDER — MIDAZOLAM HCL 5 MG/5ML IJ SOLN
INTRAMUSCULAR | Status: DC | PRN
  Administered 2012-11-29: 2 mg via INTRAVENOUS

## 2012-11-29 MED ORDER — HYDROCODONE-ACETAMINOPHEN 5-325 MG PO TABS
1.0000 | ORAL_TABLET | ORAL | Status: DC | PRN
  Administered 2012-11-29: 2 via ORAL
  Administered 2012-11-29 – 2012-11-30 (×4): 1 via ORAL
  Filled 2012-11-29 (×2): qty 1
  Filled 2012-11-29: qty 2
  Filled 2012-11-29 (×2): qty 1

## 2012-11-29 MED ORDER — IOHEXOL 300 MG/ML  SOLN
INTRAMUSCULAR | Status: AC
  Filled 2012-11-29: qty 1

## 2012-11-29 MED ORDER — METRONIDAZOLE IN NACL 5-0.79 MG/ML-% IV SOLN
500.0000 mg | Freq: Three times a day (TID) | INTRAVENOUS | Status: DC
  Administered 2012-11-29 – 2012-11-30 (×3): 500 mg via INTRAVENOUS
  Filled 2012-11-29 (×5): qty 100

## 2012-11-29 MED ORDER — LACTATED RINGERS IV SOLN
INTRAVENOUS | Status: DC | PRN
  Administered 2012-11-29: 1000 mL via INTRAVENOUS

## 2012-11-29 MED ORDER — SODIUM CHLORIDE 0.9 % IV SOLN
INTRAVENOUS | Status: DC | PRN
  Administered 2012-11-29: 15:00:00

## 2012-11-29 MED ORDER — BUPIVACAINE-EPINEPHRINE PF 0.25-1:200000 % IJ SOLN
INTRAMUSCULAR | Status: DC | PRN
  Administered 2012-11-29: 10 mL

## 2012-11-29 MED ORDER — HYDROMORPHONE HCL PF 1 MG/ML IJ SOLN
1.0000 mg | INTRAMUSCULAR | Status: DC | PRN
  Administered 2012-11-29: 1 mg via INTRAVENOUS
  Filled 2012-11-29: qty 1

## 2012-11-29 MED ORDER — GLUCAGON HCL (RDNA) 1 MG IJ SOLR
INTRAMUSCULAR | Status: DC | PRN
  Administered 2012-11-29: 2 mg via INTRAVENOUS

## 2012-11-29 MED ORDER — HYDROCODONE-ACETAMINOPHEN 5-325 MG PO TABS: 1.0000 | ORAL_TABLET | ORAL | Status: DC | PRN

## 2012-11-29 MED ORDER — BUTAMBEN-TETRACAINE-BENZOCAINE 2-2-14 % EX AERO
INHALATION_SPRAY | CUTANEOUS | Status: DC | PRN
  Administered 2012-11-29: 2 via TOPICAL

## 2012-11-29 MED ORDER — ONDANSETRON HCL 4 MG/2ML IJ SOLN
4.0000 mg | Freq: Four times a day (QID) | INTRAMUSCULAR | Status: DC | PRN
  Administered 2012-11-29: 4 mg via INTRAVENOUS
  Filled 2012-11-29: qty 2

## 2012-11-29 MED ORDER — SUCCINYLCHOLINE CHLORIDE 20 MG/ML IJ SOLN
INTRAMUSCULAR | Status: DC | PRN
  Administered 2012-11-29: 100 mg via INTRAVENOUS

## 2012-11-29 MED ORDER — LIDOCAINE HCL (CARDIAC) 20 MG/ML IV SOLN
INTRAVENOUS | Status: DC | PRN
  Administered 2012-11-29: 50 mg via INTRAVENOUS

## 2012-11-29 MED ORDER — HYDROMORPHONE HCL PF 1 MG/ML IJ SOLN
0.2500 mg | INTRAMUSCULAR | Status: DC | PRN
  Administered 2012-11-29: 0.5 mg via INTRAVENOUS

## 2012-11-29 MED ORDER — SODIUM CHLORIDE 0.9 % IV SOLN
INTRAVENOUS | Status: DC | PRN
  Administered 2012-11-29: 11:00:00

## 2012-11-29 MED ORDER — BUPIVACAINE-EPINEPHRINE 0.25% -1:200000 IJ SOLN
INTRAMUSCULAR | Status: AC
  Filled 2012-11-29: qty 1

## 2012-11-29 MED ORDER — SODIUM CHLORIDE 0.9 % IV SOLN: INTRAVENOUS | Status: DC

## 2012-11-29 MED ORDER — DIPHENHYDRAMINE HCL 50 MG/ML IJ SOLN
INTRAMUSCULAR | Status: AC
  Filled 2012-11-29: qty 1

## 2012-11-29 MED ORDER — GLUCAGON HCL (RDNA) 1 MG IJ SOLR
INTRAMUSCULAR | Status: AC
  Filled 2012-11-29: qty 2

## 2012-11-29 MED ORDER — DEXAMETHASONE SODIUM PHOSPHATE 10 MG/ML IJ SOLN
INTRAMUSCULAR | Status: DC | PRN
  Administered 2012-11-29: 10 mg via INTRAVENOUS

## 2012-11-29 MED ORDER — DIPHENHYDRAMINE HCL 12.5 MG/5ML PO ELIX: 12.5000 mg | ORAL_SOLUTION | Freq: Four times a day (QID) | ORAL | Status: DC | PRN

## 2012-11-29 MED ORDER — ACETAMINOPHEN 10 MG/ML IV SOLN
INTRAVENOUS | Status: AC
  Filled 2012-11-29: qty 100

## 2012-11-29 MED ORDER — CIPROFLOXACIN IN D5W 400 MG/200ML IV SOLN
INTRAVENOUS | Status: AC
  Filled 2012-11-29: qty 200

## 2012-11-29 MED ORDER — MIDAZOLAM HCL 10 MG/2ML IJ SOLN
INTRAMUSCULAR | Status: AC
  Filled 2012-11-29: qty 4

## 2012-11-29 MED ORDER — LACTATED RINGERS IV SOLN: INTRAVENOUS | Status: DC

## 2012-11-29 MED ORDER — PROMETHAZINE HCL 25 MG/ML IJ SOLN: 6.2500 mg | INTRAMUSCULAR | Status: DC | PRN

## 2012-11-29 MED ORDER — DIPHENHYDRAMINE HCL 50 MG/ML IJ SOLN: 12.5000 mg | Freq: Four times a day (QID) | INTRAMUSCULAR | Status: DC | PRN

## 2012-11-29 MED ORDER — ONDANSETRON HCL 4 MG/2ML IJ SOLN
INTRAMUSCULAR | Status: DC | PRN
  Administered 2012-11-29: 4 mg via INTRAVENOUS

## 2012-11-29 MED ORDER — LACTATED RINGERS IV SOLN
INTRAVENOUS | Status: DC | PRN
  Administered 2012-11-29: 09:00:00 via INTRAVENOUS

## 2012-11-29 MED ORDER — CIPROFLOXACIN IN D5W 400 MG/200ML IV SOLN
400.0000 mg | INTRAVENOUS | Status: AC
  Administered 2012-11-29: 400 mg via INTRAVENOUS

## 2012-11-29 MED ORDER — GLYCOPYRROLATE 0.2 MG/ML IJ SOLN
INTRAMUSCULAR | Status: DC | PRN
  Administered 2012-11-29: .7 mg via INTRAVENOUS

## 2012-11-29 MED ORDER — OXYCODONE-ACETAMINOPHEN 5-325 MG PO TABS: 1.0000 | ORAL_TABLET | ORAL | Status: DC | PRN

## 2012-11-29 MED ORDER — GLUCAGON HCL (RDNA) 1 MG IJ SOLR
INTRAMUSCULAR | Status: DC | PRN
  Administered 2012-11-29: .25 mg via INTRAVENOUS

## 2012-11-29 MED ORDER — METRONIDAZOLE IN NACL 5-0.79 MG/ML-% IV SOLN
500.0000 mg | INTRAVENOUS | Status: AC
  Administered 2012-11-29: 500 mg via INTRAVENOUS

## 2012-11-29 MED ORDER — ROCURONIUM BROMIDE 100 MG/10ML IV SOLN
INTRAVENOUS | Status: DC | PRN
  Administered 2012-11-29: 10 mg via INTRAVENOUS
  Administered 2012-11-29: 30 mg via INTRAVENOUS
  Administered 2012-11-29: 10 mg via INTRAVENOUS

## 2012-11-29 MED ORDER — FENTANYL CITRATE 0.05 MG/ML IJ SOLN
INTRAMUSCULAR | Status: DC | PRN
  Administered 2012-11-29: 100 ug via INTRAVENOUS
  Administered 2012-11-29 (×3): 50 ug via INTRAVENOUS

## 2012-11-29 MED ORDER — CIPROFLOXACIN IN D5W 400 MG/200ML IV SOLN
400.0000 mg | Freq: Two times a day (BID) | INTRAVENOUS | Status: DC
  Administered 2012-11-29 – 2012-11-30 (×2): 400 mg via INTRAVENOUS
  Filled 2012-11-29 (×3): qty 200

## 2012-11-29 MED ORDER — MIDAZOLAM HCL 10 MG/2ML IJ SOLN
INTRAMUSCULAR | Status: DC | PRN
  Administered 2012-11-29: 2 mg via INTRAVENOUS
  Administered 2012-11-29: 1 mg via INTRAVENOUS
  Administered 2012-11-29 (×2): 2 mg via INTRAVENOUS

## 2012-11-29 MED ORDER — FENTANYL CITRATE 0.05 MG/ML IJ SOLN
INTRAMUSCULAR | Status: DC | PRN
  Administered 2012-11-29: 25 ug via INTRAVENOUS
  Administered 2012-11-29: 12.5 ug via INTRAVENOUS
  Administered 2012-11-29 (×2): 25 ug via INTRAVENOUS

## 2012-11-29 MED ORDER — PROPOFOL 10 MG/ML IV BOLUS
INTRAVENOUS | Status: DC | PRN
  Administered 2012-11-29: 150 mg via INTRAVENOUS

## 2012-11-29 SURGICAL SUPPLY — 37 items
APPLIER CLIP 5 13 M/L LIGAMAX5 (MISCELLANEOUS) ×2
BENZOIN TINCTURE PRP APPL 2/3 (GAUZE/BANDAGES/DRESSINGS) ×2 IMPLANT
CABLE HIGH FREQUENCY MONO STRZ (ELECTRODE) ×2 IMPLANT
CANISTER SUCTION 2500CC (MISCELLANEOUS) IMPLANT
CHLORAPREP W/TINT 26ML (MISCELLANEOUS) ×2 IMPLANT
CHOLANGIOGRAM CATH TAUT (CATHETERS) ×2 IMPLANT
CLIP APPLIE 5 13 M/L LIGAMAX5 (MISCELLANEOUS) ×1 IMPLANT
CLOTH BEACON ORANGE TIMEOUT ST (SAFETY) ×2 IMPLANT
COVER MAYO STAND STRL (DRAPES) ×2 IMPLANT
DECANTER SPIKE VIAL GLASS SM (MISCELLANEOUS) ×2 IMPLANT
DEVICE TROCAR PUNCTURE CLOSURE (ENDOMECHANICALS) ×2 IMPLANT
DRAPE C-ARM 42X72 X-RAY (DRAPES) IMPLANT
DRAPE LAPAROSCOPIC ABDOMINAL (DRAPES) ×2 IMPLANT
DRAPE UTILITY XL STRL (DRAPES) ×2 IMPLANT
ELECT REM PT RETURN 9FT ADLT (ELECTROSURGICAL) ×2
ELECTRODE REM PT RTRN 9FT ADLT (ELECTROSURGICAL) ×1 IMPLANT
ENDOLOOP SUT PDS II  0 18 (SUTURE) ×1
ENDOLOOP SUT PDS II 0 18 (SUTURE) ×1 IMPLANT
GLOVE BIO SURGEON STRL SZ7.5 (GLOVE) ×2 IMPLANT
GOWN STRL REIN XL XLG (GOWN DISPOSABLE) ×6 IMPLANT
KIT BASIN OR (CUSTOM PROCEDURE TRAY) ×2 IMPLANT
NEEDLE INSUFFLATION 14GA 120MM (NEEDLE) IMPLANT
NS IRRIG 1000ML POUR BTL (IV SOLUTION) ×2 IMPLANT
RINGERS IRRIG 1000ML POUR BTL (IV SOLUTION) IMPLANT
SCISSORS LAP 5X35 DISP (ENDOMECHANICALS) ×2 IMPLANT
SET CHOLANGIOGRAPH MIX (MISCELLANEOUS) IMPLANT
SET IRRIG TUBING LAPAROSCOPIC (IRRIGATION / IRRIGATOR) ×2 IMPLANT
SOLUTION ANTI FOG 6CC (MISCELLANEOUS) ×2 IMPLANT
SPONGE GAUZE 4X4 12PLY (GAUZE/BANDAGES/DRESSINGS) ×2 IMPLANT
STRIP CLOSURE SKIN 1/2X4 (GAUZE/BANDAGES/DRESSINGS) ×2 IMPLANT
SUT MNCRL AB 4-0 PS2 18 (SUTURE) ×2 IMPLANT
SUT VICRYL 0 UR6 27IN ABS (SUTURE) ×2 IMPLANT
TOWEL OR 17X26 10 PK STRL BLUE (TOWEL DISPOSABLE) ×2 IMPLANT
TRAY LAP CHOLE (CUSTOM PROCEDURE TRAY) ×2 IMPLANT
TROCAR BLADELESS OPT 5 75 (ENDOMECHANICALS) ×6 IMPLANT
TROCAR XCEL NON-BLD 11X100MML (ENDOMECHANICALS) ×2 IMPLANT
TUBING INSUFFLATION 10FT LAP (TUBING) ×2 IMPLANT

## 2012-11-29 NOTE — Anesthesia Postprocedure Evaluation (Signed)
  Anesthesia Post-op Note  Patient: Lindsay Hurley  Procedure(s) Performed: Procedure(s) (LRB): LAPAROSCOPIC CHOLECYSTECTOMY WITH INTRAOPERATIVE CHOLANGIOGRAM (N/A)  Patient Location: PACU  Anesthesia Type: General  Level of Consciousness: awake and alert   Airway and Oxygen Therapy: Patient Spontanous Breathing  Post-op Pain: mild  Post-op Assessment: Post-op Vital signs reviewed, Patient's Cardiovascular Status Stable, Respiratory Function Stable, Patent Airway and No signs of Nausea or vomiting  Last Vitals:  Filed Vitals:   11/29/12 1200  BP: 117/69  Pulse: 54  Temp: 36.1 C  Resp: 14    Post-op Vital Signs: stable   Complications: No apparent anesthesia complications

## 2012-11-29 NOTE — Consult Note (Signed)
Referring Provider: Dr. Axel Filler Primary Care Physician:  Altamese Gilliam, MD Primary Gastroenterologist:  None, unassigned  Reason for Consultation:  Retained CBD   HPI: Lindsay Hurley is a 42 y.o. female who underwent cholecystectomy earlier today by Dr. Derrell Lolling.  IOC showed retained CBD stone near the ampulla with significant biliary ductal dilatation.  She is still slightly drowsy from the anesthesia but she seemed to understand the situation.  I spoke with her husband as well who signed the consent form for her.  She denies any nausea currently.  Abdomen is sore.   Past Medical History  Diagnosis Date  . Anemia   . Herpes genitalia     Past Surgical History  Procedure Date  . Cervical polypectomy   . Hysteroscopy   . Tubal ligation   . Cesarean section 03/26/2012    Procedure: CESAREAN SECTION;  Surgeon: Kathreen Cosier, MD;  Location: [facility];  Service: Gynecology;  Laterality: N/A;    Prior to Admission medications   Medication Sig Start Date End Date Taking? Authorizing Provider  acetaminophen-codeine (TYLENOL #3) 300-30 MG per tablet Take 1 tablet by mouth every 4 (four) hours as needed. For pain   Yes Historical Provider, MD  HYDROcodone-acetaminophen (NORCO/VICODIN) 5-325 MG per tablet Take 1-2 tablets by mouth every 4 (four) hours as needed for pain. 11/17/12  Yes Raeford Razor, MD  ibuprofen (ADVIL,MOTRIN) 200 MG tablet Take 200 mg by mouth every 6 (six) hours as needed. Pain   Yes Historical Provider, MD  oxyCODONE-acetaminophen (ROXICET) 5-325 MG per tablet Take 1 tablet by mouth every 4 (four) hours as needed for pain. 11/29/12   Axel Filler, MD    Current Facility-Administered Medications  Medication Dose Route Frequency Provider Last Rate Last Dose  . HYDROmorphone (DILAUDID) injection 0.25-0.5 mg  0.25-0.5 mg Intravenous Q5 min PRN Azell Der, MD      . lactated ringers infusion   Intravenous Continuous Paris Lore, CRNA      . promethazine  (PHENERGAN) injection 6.25-12.5 mg  6.25-12.5 mg Intravenous Q15 min PRN Azell Der, MD        Allergies as of 11/18/2012 - Review Complete 11/18/2012  Allergen Reaction Noted  . Penicillins Hives 10/31/2011    Family History  Problem Relation Age of Onset  . Anesthesia problems Neg Hx   . Asthma Brother   . Diabetes Maternal Aunt   . Stroke Maternal Aunt   . Alcohol abuse Maternal Uncle   . Drug abuse Maternal Uncle   . Cancer Maternal Uncle   . Alcohol abuse Paternal Uncle   . Drug abuse Paternal Uncle   . Cancer Maternal Grandmother     Lung  . COPD Maternal Grandfather   . Depression Cousin   . Cancer Father     Throat    History   Social History  . Marital Status: Married    Spouse Name: N/A    Number of Children: N/A  . Years of Education: N/A   Occupational History  . Not on file.   Social History Main Topics  . Smoking status: Never Smoker   . Smokeless tobacco: Never Used  . Alcohol Use: No  . Drug Use: No  . Sexually Active: Yes     Comment: tubal ligation   Other Topics Concern  . Not on file   Social History Narrative  . No narrative on file    Review of Systems: Ten point ROS is O/W negative except as  mentioned in HPI.  Physical Exam: Vital signs in last 24 hours: Temp:  [97.6 F (36.4 C)-98.2 F (36.8 C)] 97.6 F (36.4 C) (02/07 1120) Pulse Rate:  [53-72] 53  (02/07 1145) Resp:  [11-20] 11  (02/07 1145) BP: (108-118)/(51-64) 115/64 mmHg (02/07 1145) SpO2:  [100 %] 100 % (02/07 1145)   General:   Alert, Well-developed, well-nourished, pleasant and cooperative in NAD; still slightly drowsy from anesthesia Head:  Normocephalic and atraumatic. Eyes:  Sclera clear, no icterus.  Conjunctiva pink. Ears:  Normal auditory acuity. Mouth:  No deformity or lesions.   Lungs:  Clear throughout to auscultation.  No wheezes, crackles, or rhonchi.  Heart:  Bradycardic with regular rhythm; no murmurs, clicks, rubs,  or gallops. Abdomen:   Soft, non-distended, BS hypoactive, small incisions on abdomen with some mild TTP. Rectal:  Deferred  Msk:  Symmetrical without gross deformities. Pulses:  Normal pulses noted. Extremities:  Without clubbing or edema. Neurologic:  Alert and  oriented x4;  grossly normal neurologically. Skin:  Intact without significant lesions or rashes. Psych:  Alert and cooperative. Normal mood and affect.  Intake/Output this shift: Total I/O In: 500 [I.V.:500] Out: 25 [Blood:25]  Studies/Results: Dg Cholangiogram Operative  11/29/2012  *RADIOLOGY REPORT*  Clinical Data:   Cholecystectomy for cholelithiasis.  INTRAOPERATIVE CHOLANGIOGRAM  Technique:  Cholangiographic images from the C-arm fluoroscopic device were submitted for interpretation post-operatively.  Please see the procedural report for the amount of contrast and the fluoroscopy time utilized.  Comparison:  Ultrasound dated 10/05/2002  Findings:  An intraoperative cholangiogram obtained with a C-arm shows massive dilatation of the common bile duct, other visualized ducts and the cystic duct.  There is at least one persistent filling defect in the distal CBD near the ampulla which appears relatively obstructive with a small amount of contrast seen passing around the calculus and into the duodenum.  No contrast extravasation is identified.  IMPRESSION: Evidence of choledocholithiasis by intraoperative cholangiography with at least one impacted calculus present in the distal CBD near the ampulla.  There is associated significant biliary ductal dilatation.   Original Report Authenticated By: Irish Lack, M.D.     IMPRESSION:  -Retained CBD stone seen on IOC s/p cholecystectomy  PLAN: -ERCP today.  She is on abx already.  ZEHR, JESSICA D.  11/29/2012, 11:49 AM  Pager number [phone]    I have taken a history, examined the patient and reviewed the chart. I agree with the Advanced Practitioner's note, impression and recommendations. Lap chole  today with IOC showing a distal CBD stone and ductal dilation. Significantly elevated LFTs were noted pre-op. ERCP/sphincterotomy/stone extraction today. She understands the risk, benefits and alternative to ERCP and consents to proceed.   Meryl Dare MD [facility]

## 2012-11-29 NOTE — H&P (View-Only) (Signed)
Patient ID: Lindsay Hurley, female   DOB: 05-Jun-1971, 42 y.o.   MRN: [id number]  Chief Complaint  Patient presents with  . New Evaluation    eval gallstones    HPI Lindsay Hurley is a 42 y.o. female.  The patient's benign-year-old female who was recently seen in the ED for right upper quadrant pain. He states she's had pain prior to her pregnancy approximately 9-12 months ago.  The patient has been epigastric right upper quadrant pain associated with nausea and vomiting after meals, mainly high fatty meals. HPI  Past Medical History  Diagnosis Date  . Anemia   . Herpes genitalia     Past Surgical History  Procedure Date  . Cervical polypectomy   . Hysteroscopy   . Tubal ligation   . Cesarean section 03/26/2012    Procedure: CESAREAN SECTION;  Surgeon: Kathreen Cosier, MD;  Location: [facility];  Service: Gynecology;  Laterality: N/A;    Family History  Problem Relation Age of Onset  . Anesthesia problems Neg Hx   . Asthma Brother   . Diabetes Maternal Aunt   . Stroke Maternal Aunt   . Alcohol abuse Maternal Uncle   . Drug abuse Maternal Uncle   . Cancer Maternal Uncle   . Alcohol abuse Paternal Uncle   . Drug abuse Paternal Uncle   . Cancer Maternal Grandmother     Lung  . COPD Maternal Grandfather   . Depression Cousin   . Cancer Father     Throat    Social History History  Substance Use Topics  . Smoking status: Never Smoker   . Smokeless tobacco: Never Used  . Alcohol Use: No    Allergies  Allergen Reactions  . Penicillins Hives    Current Outpatient Prescriptions  Medication Sig Dispense Refill  . acetaminophen-codeine (TYLENOL #3) 300-30 MG per tablet Take 1 tablet by mouth every 4 (four) hours as needed. For pain      . HYDROcodone-acetaminophen (NORCO/VICODIN) 5-325 MG per tablet Take 1-2 tablets by mouth every 4 (four) hours as needed for pain.  20 tablet  0  . ibuprofen (ADVIL,MOTRIN) 200 MG tablet Take 200 mg by mouth every 6 (six) hours as  needed.        Review of Systems Review of Systems  Constitutional: Negative.   HENT: Negative.   Respiratory: Negative.   Cardiovascular: Negative.   Gastrointestinal: Positive for nausea, vomiting and abdominal pain.  Musculoskeletal: Negative.   Neurological: Negative.     Blood pressure 124/76, pulse 64, temperature 96.7 F (35.9 C), temperature source Temporal, resp. rate 16, height 5\' 4"  (1.626 m), weight 207 lb (93.895 kg), last menstrual period 11/16/2012.  Physical Exam Physical Exam  Constitutional: She is oriented to person, place, and time. She appears well-developed and well-nourished.  HENT:  Head: Normocephalic and atraumatic.  Eyes: Conjunctivae normal and EOM are normal. Pupils are equal, round, and reactive to light.  Neck: Normal range of motion. Neck supple.  Cardiovascular: Normal rate, regular rhythm and normal heart sounds.   Pulmonary/Chest: Effort normal.  Abdominal: Soft. Bowel sounds are normal. She exhibits no distension and no mass. There is tenderness (ruq). There is no rebound and no guarding.  Musculoskeletal: Normal range of motion.  Neurological: She is alert and oriented to person, place, and time.    Data Reviewed Ultrasound which revealed gallstones. Her LFTs including her AST /ALT have been elevated as well as alkaline phosphatase. Her T. bili 1.2  Assessment  42 year old female right upper quadrant pain and symptomatic cholelithiasis    Plan    1. We'll proceed to the operating room for laparoscopic cholecystectomy with intraoperative Cholangiogram..  2.All risks and benefits were discussed with the patient, to generally include infection, bleeding, damage to surrounding structures, and recurrence. Alternatives were offered and described.  All questions were answered and the patient voiced understanding of the procedure and wishes to proceed at this point.        Marigene Ehlers., Jebidiah Baggerly 11/18/2012, 3:58 PM

## 2012-11-29 NOTE — Op Note (Addendum)
[facility] [address] [place], [address]   ERCP PROCEDURE REPORT  PATIENT: Lindsay Hurley, Lindsay Hurley.  MR# :[id number] BIRTHDATE: May 04, 1971  GENDER: Female ENDOSCOPIST: Meryl Dare, MD, [facility] REFERRED BY: Axel Filler, MD PROCEDURE DATE:  11/29/2012 PROCEDURE:   ERCP with sphincterotomy/papillotomy and ERCP with removal of calculus/calculi ASA CLASS:   Class II INDICATIONS:established bile duct stone(s).   abnormal intraoperative cholangiogram .   abnormal liver function test . MEDICATIONS: These medications were titrated to patient response per physician's verbal order, Fentanyl 87.5 mcg IV, Versed 7 mg IV, and Glucagon .25 mg IV TOPICAL ANESTHETIC: Cetacaine Spray DESCRIPTION OF PROCEDURE:   After the risks benefits and alternatives of the procedure were thoroughly explained, informed consent was obtained.  The Pentax ERCP [id number]  endoscope was introduced through the mouth  and advanced to the second portion of the duodenum . The PD was not cannulated or filled by intention. Retained solid food in the stomach noted. 1.  The ampulla was located in 2nd duodenum.  The ampulla appeared normal. 2.  With a guidewire in the bile duct, a biliary sphincterotomy was performed using the sphincterotome. 3.  A single stone was seen in the mid common bile duct and the CBD and intrahepatic ducts were dilated. 4.  Using a stone extraction balloon the bile duct was swept three times.  A single stone measure 7 mm was removed from the bile duct successfully on the first sweep. Excellent biliary drainage was then noted.  The scope was then completely withdrawn from the patient and the procedure completed.     COMPLICATIONS: .  There were no complications.  ENDOSCOPIC IMPRESSION: 1.   Common bile duct stone; sphincterotomy performed and stone removed 2.   Biliary ductal dilation 3.   S/P cholecystectomy 4.   Retained gastric solid food  RECOMMENDATIONS: 1.   Liver enzymes in 4 weeks    eSigned:  Meryl Dare, MD, [facility] 11/29/2012 2:46 PM Revised: 11/29/2012 2:46 PM

## 2012-11-29 NOTE — Transfer of Care (Signed)
Immediate Anesthesia Transfer of Care Note  Patient: Lindsay Hurley  Procedure(s) Performed: Procedure(s) (LRB): LAPAROSCOPIC CHOLECYSTECTOMY WITH INTRAOPERATIVE CHOLANGIOGRAM (N/A)  Patient Location: PACU  Anesthesia Type: General  Level of Consciousness: sedated, patient cooperative and responds to stimulaton  Airway & Oxygen Therapy: Patient Spontanous Breathing and Patient connected to face mask oxgen  Post-op Assessment: Report given to PACU RN and Post -op Vital signs reviewed and stable  Post vital signs: Reviewed and stable  Complications: No apparent anesthesia complications

## 2012-11-29 NOTE — Interval H&P Note (Signed)
History and Physical Interval Note:  11/29/2012 9:07 AM  Lindsay Hurley  has presented today for surgery, with the diagnosis of gallstones  The various methods of treatment have been discussed with the patient and family. After consideration of risks, benefits and other options for treatment, the patient has consented to  Procedure(s) (LRB) with comments: LAPAROSCOPIC CHOLECYSTECTOMY WITH INTRAOPERATIVE CHOLANGIOGRAM (N/A) as a surgical intervention .  The patient's history has been reviewed, patient examined, no change in status, stable for surgery.  I have reviewed the patient's chart and labs.  Questions were answered to the patient's satisfaction.     Marigene Ehlers., Jed Limerick

## 2012-11-29 NOTE — Op Note (Signed)
Pre Operative Diagnosis: symptomatic cholelithiasis  Post Operative Diagnosis:  Cholelithiasis and choledocholithiasis  Surgeon: Dr. Axel Filler   Procedure: laparoscopic cholecystectomy with intraoperative cholangiogram  Assistant: none  Anesthesia: Gen. Endotracheal anesthesia   EBL: 5 cc  Complications:  Counts: reported as correct x 2   Findings: The patient had had a common bile duct stone impacted at the ampulla. Patient also had gallstones within the cystic duct of which 3 were removed.  Indications for procedure: the patient is a 42 year old female who had been seen previously for symptomatic cholelithiasis. Patient had pain after eating. She decided to have her gallbladder electively removed.  Details of the procedure:  The patient was taken to the operating and placed in the supine position with bilateral SCDs in place. A time out was called and all facts were verified. A pneumoperitoneum was obtained via A Veress needle technique to a pressure of 14mm of mercury. A 5mm trochar was then placed in the right upper quadrant under visualization, and there were no injuries to any abdominal organs. A 11 mm port was then placed in the umbilical region after infiltrating with local anesthesia under direct visualization. A second and third epigastric port and right lower quadrant port placement under direct visualization, respectively. The gallbladder was identified and retracted, the peritoneum was then sharply dissected from the gallbladder and this dissection was carried down to Calot's triangle. The gallbladder was identified and stripped away circumferentially and seen going into the gallbladder 360. A Cook catheter was used to perform an intraoperative cholangiogram. The biliary radicals as well as the cystic duct and were enlarged.  Common bile duct was seen to have a stone at the ampulla. We gave 2 mg of glucagon and waited 2 minutes. We we shot a cholangiogram and the common  bile duct stone remained impacted at the ampulla.  Secondary to the diameter of the cystic duct the cystic duct was transected an Endoloop was placed proximally. The cystic artery was identified and 2 clips placed proximally and one distally and transected.  We then proceeded to remove the gallbladder off the hepatic fossa with Bovie cautery. An latex retrieval bag was then placed in the abdomen and gallbladder placed in the bag. The hepatic fossa was then reexamined and hemostasis was achieved with Bovie cautery and was excellent at the end of the case. The subhepatic fossa and perihepatic fossa was then irrigated until the effluent was clear. The 11 mm trocar fascia was reapproximated with the Endo Close #1 Vicryl.  The pneumoperitoneum was evacuated and all trochars removed under direct visulalization.  The skin was then closed with 4-0 Monocryl and the skin dressed with Steri-Strips, gauze, and tape.  The patient was awaken from general anesthesia and taken to the recovery room in stable condition.

## 2012-11-29 NOTE — Interval H&P Note (Signed)
History and Physical Interval Note:  11/29/2012 2:03 PM  Lindsay Hurley  has presented today for surgery, with the diagnosis of Retained CBD stone  The various methods of treatment have been discussed with the patient and family. After consideration of risks, benefits and other options for treatment, the patient has consented to  Procedure(s) (LRB) with comments: ENDOSCOPIC RETROGRADE CHOLANGIOPANCREATOGRAPHY (ERCP) (N/A) as a surgical intervention .  The patient's history has been reviewed, patient examined, no change in status, stable for surgery.  I have reviewed the patient's chart and labs.  Questions were answered to the patient's satisfaction.     Venita Lick. Russella Dar MD Clementeen Graham

## 2012-11-29 NOTE — Anesthesia Preprocedure Evaluation (Signed)
Anesthesia Evaluation  Patient identified by MRN, date of birth, ID band Patient awake    Reviewed: Allergy & Precautions, H&P , NPO status , Patient's Chart, lab work & pertinent test results  Airway Mallampati: II TM Distance: >3 FB Neck ROM: Full    Dental No notable dental hx.    Pulmonary neg pulmonary ROS,  breath sounds clear to auscultation  Pulmonary exam normal       Cardiovascular Exercise Tolerance: Good negative cardio ROS  Rhythm:Regular Rate:Normal     Neuro/Psych negative neurological ROS  negative psych ROS   GI/Hepatic negative GI ROS, Neg liver ROS,   Endo/Other  negative endocrine ROS  Renal/GU negative Renal ROS  negative genitourinary   Musculoskeletal negative musculoskeletal ROS (+)   Abdominal (+) + obese,   Peds negative pediatric ROS (+)  Hematology negative hematology ROS (+)   Anesthesia Other Findings   Reproductive/Obstetrics negative OB ROS                           Anesthesia Physical Anesthesia Plan  ASA: II  Anesthesia Plan: General   Post-op Pain Management:    Induction: Intravenous  Airway Management Planned: Oral ETT  Additional Equipment:   Intra-op Plan:   Post-operative Plan: Extubation in OR  Informed Consent: I have reviewed the patients History and Physical, chart, labs and discussed the procedure including the risks, benefits and alternatives for the proposed anesthesia with the patient or authorized representative who has indicated his/her understanding and acceptance.   Dental advisory given  Plan Discussed with: CRNA  Anesthesia Plan Comments:         Anesthesia Quick Evaluation

## 2012-11-30 LAB — COMPREHENSIVE METABOLIC PANEL
ALT: 270 U/L — ABNORMAL HIGH (ref 0–35)
Alkaline Phosphatase: 590 U/L — ABNORMAL HIGH (ref 39–117)
BUN: 5 mg/dL — ABNORMAL LOW (ref 6–23)
CO2: 24 mEq/L (ref 19–32)
Calcium: 8.9 mg/dL (ref 8.4–10.5)
GFR calc Af Amer: >90 mL/min (ref >90)
GFR calc non Af Amer: >90 mL/min (ref >90)
Glucose, Bld: 124 mg/dL — ABNORMAL HIGH (ref 70–99)
Potassium: 4 mEq/L (ref 3.5–5.1)
Sodium: 135 mEq/L (ref 135–145)
Total Protein: 6.3 g/dL (ref 6.0–8.3)

## 2012-11-30 NOTE — Discharge Summary (Signed)
Physician Discharge Summary  Patient ID: ALEXXUS SOBH MRN: [id number] DOB/AGE: August 15, 1971 42 y.o.  Admit date: 11/29/2012 Discharge date: 11/30/2012  Admission Diagnoses: s/p lap chole with CBD stone  Discharge Diagnoses:  S/p lap chole, s/p ERCP and removal of CBD stone Active Problems:   Calculus of bile duct without mention of cholecystitis or obstruction   Nonspecific elevation of levels of transaminase or lactic acid dehydrogenase (LDH)   Nonspecific (abnormal) findings on radiological and other examination of biliary tract   Discharged Condition: good  Hospital Course: Pt was admitted after a Lap chole on 11/29/2012.  Pt had a CBD stone that was seen intraop.  GI wasconsulted and pt underwent an ERCP with stone extraction.  Pt tol reg diet.  Pain controlled well.  Deemed stable for DC  Consults: GI  Significant Diagnostic Studies: endoscopy: ERCP: with stone extractionk see GI OP note  Treatments: surgery: 11/30/2012  Discharge Exam: Blood pressure 98/60, pulse 63, temperature 98.4 F (36.9 C), temperature source Oral, resp. rate 14, height 5\' 4"  (1.626 m), weight 204 lb (92.534 kg), last menstrual period 11/16/2012, SpO2 100.00%. General appearance: alert and cooperative GI: soft, non-tender; bowel sounds normal; no masses,  no organomegaly  Disposition: 01-Home or Self Care   Future Appointments Provider Department Dept Phone   12/19/2012 11:20 AM Axel Filler, MD [facility], [place] [phone]       Medication List    TAKE these medications       oxyCODONE-acetaminophen 5-325 MG per tablet  Commonly known as:  ROXICET  Take 1 tablet by mouth every 4 (four) hours as needed for pain.      ASK your doctor about these medications       acetaminophen-codeine 300-30 MG per tablet  Commonly known as:  TYLENOL #3  Take 1 tablet by mouth every 4 (four) hours as needed. For pain     HYDROcodone-acetaminophen 5-325 MG per tablet  Commonly known as:   NORCO/VICODIN  Take 1-2 tablets by mouth every 4 (four) hours as needed for pain.     ibuprofen 200 MG tablet  Commonly known as:  ADVIL,MOTRIN  Take 200 mg by mouth every 6 (six) hours as needed. Pain           Follow-up Information   Follow up with Lajean Saver, MD. Schedule an appointment as soon as possible for a visit in 2 weeks.   Contact information:   [address] [place] [address] [phone]       Signed: Marigene Ehlers., Jed Limerick 11/30/2012, 11:47 AM

## 2012-11-30 NOTE — Progress Notes (Addendum)
[facility] Progress Note  Subjective:  Feels well this AM.  Tolerated clear liquids.  Minimal pain.  Passing gas.  Objective:  Vital signs in last 24 hours: Temp:  [97 F (36.1 C)-98.9 F (37.2 C)] 97.8 F (36.6 C) (02/08 0546) Pulse Rate:  [52-80] 68 (02/08 0546) Resp:  [9-16] 14 (02/08 0546) BP: (91-140)/(40-91) 96/63 mmHg (02/08 0546) SpO2:  [96 %-100 %] 100 % (02/08 0546) Weight:  [204 lb (92.534 kg)] 204 lb (92.534 kg) (02/07 1223) Last BM Date: 11/28/12 General:   Alert, Well-developed, in NAD Heart:  Regular rate and rhythm; no murmurs Pulm:  CTAB.  No W/R/R. Abdomen:  Soft, nondistended. Normal bowel sounds.  Minimal TTP. Extremities:  Without edema. Neurologic:  Alert and  oriented x4;  grossly normal neurologically. Psych:  Alert and cooperative. Normal mood and affect.  Intake/Output from previous day: 02/07 0701 - 02/08 0700 In: 750 [I.V.:750] Out: 1275 [Urine:1250; Blood:25]  Lab Results: No results found for this basename: WBC, HGB, HCT, PLT,  in the last 72 hours BMET  Recent Labs  11/30/12 0426  NA 135  K 4.0  CL 100  CO2 24  GLUCOSE 124*  BUN 5*  CREATININE 0.75  CALCIUM 8.9   LFT  Recent Labs  11/30/12 0426  PROT 6.3  ALBUMIN 3.0*  AST 113*  ALT 270*  ALKPHOS 590*  BILITOT 1.5*   PT/INR  Recent Labs  11/29/12 1305  LABPROT 12.7  INR 0.96   Dg Cholangiogram Operative  11/29/2012  *RADIOLOGY REPORT*  Clinical Data:   Cholecystectomy for cholelithiasis.  INTRAOPERATIVE CHOLANGIOGRAM  Technique:  Cholangiographic images from the C-arm fluoroscopic device were submitted for interpretation post-operatively.  Please see the procedural report for the amount of contrast and the fluoroscopy time utilized.  Comparison:  Ultrasound dated 10/05/2002  Findings:  An intraoperative cholangiogram obtained with a C-arm shows massive dilatation of the common bile duct, other visualized ducts and the cystic duct.  There is at least one  persistent filling defect in the distal CBD near the ampulla which appears relatively obstructive with a small amount of contrast seen passing around the calculus and into the duodenum.  No contrast extravasation is identified.  IMPRESSION: Evidence of choledocholithiasis by intraoperative cholangiography with at least one impacted calculus present in the distal CBD near the ampulla.  There is associated significant biliary ductal dilatation.   Original Report Authenticated By: Irish Lack, M.D.    Dg Ercp Biliary & Pancreatic Ducts  11/29/2012  *RADIOLOGY REPORT*  Clinical Data: Choledocholithiasis.  ERCP  Comparison:  Intraoperative cholangiogram earlier today.  Technique:  Multiple spot images obtained with the fluoroscopic device and submitted for interpretation post-procedure.  ERCP was performed by Dr. Russella Dar.  Findings: After cannulation of the common bile duct, contrast injection shows CBD filling defect.  A balloon sweep maneuver was performed.  IMPRESSION: ERCP with balloon extraction of common bile duct calculus.  These images were submitted for radiologic interpretation only. Please see the procedural report for the amount of contrast and the fluoroscopy time utilized.   Original Report Authenticated By: Irish Lack, M.D.     Assessment / Plan: -Choledocholithiasis s/p lap chole and ERCP with sphincterotomy and stone extraction on 2/7.  *Repeat LFT's in 6 weeks and can refer back to see Dr. Russella Dar if they remain elevated.  *OK to go home today from GI standpoint.    LOS: 1 day   ZEHR, JESSICA D.  11/30/2012, 8:24 AM  Pager number [phone]  I have taken an interval history, reviewed the chart and examined the patient. I agree with the Advanced Practitioner's note, impression and recommendations. GI signing off.  Venita Lick. Russella Dar MD Clement[facility] Graham

## 2012-11-30 NOTE — Progress Notes (Signed)
Discharged from floor via w/c, spouse with pt. No changes in assessment. Satin Boal   

## 2012-12-02 ENCOUNTER — Encounter (HOSPITAL_COMMUNITY): Payer: Self-pay | Admitting: Gastroenterology

## 2012-12-03 ENCOUNTER — Ambulatory Visit (INDEPENDENT_AMBULATORY_CARE_PROVIDER_SITE_OTHER): Payer: Self-pay | Admitting: General Surgery

## 2012-12-19 ENCOUNTER — Encounter (INDEPENDENT_AMBULATORY_CARE_PROVIDER_SITE_OTHER): Payer: Self-pay | Admitting: General Surgery

## 2012-12-19 ENCOUNTER — Ambulatory Visit (INDEPENDENT_AMBULATORY_CARE_PROVIDER_SITE_OTHER): Payer: BC Managed Care – PPO | Admitting: General Surgery

## 2012-12-19 VITALS — BP 126/72 | HR 72 | Temp 98.2°F | Resp 16 | Ht 64.0 in | Wt 200.2 lb

## 2012-12-19 DIAGNOSIS — Z9089 Acquired absence of other organs: Secondary | ICD-10-CM

## 2012-12-19 DIAGNOSIS — Z9049 Acquired absence of other specified parts of digestive tract: Secondary | ICD-10-CM

## 2012-12-19 NOTE — Progress Notes (Signed)
Patient ID: Lindsay Hurley, female   DOB: [birth year], 42 y.o.   MRN: [id number] The patient is a 42 year old female status post laparoscopic cholecystectomy, choledocholithiasis status post ERCP and excision of stone.  The patient has been doing well postoperatively, and her previous symptomatology has resolved. She continues to eat a low-fat diet.   Pathology: Reveals chronic cholecystitis cholelithiasis. This was discussed with the patient.  On exam:  Her wounds are clean dry and intact  Assessment and plan: 42 year old female status post laparoscopic cholecystectomy and ERCP for choledocholithiasis. 1. Patient will follow up when necessary.

## 2013-03-26 IMAGING — US US FETAL BPP W/O NONSTRESS
1 series · 8 of 8 positions shown · non-contrast
Comparison: none

[Series 1: us fetal bpp w/o nonstress · 0.26mm/px · 8 acquisitions, 8 frames shown]
[im 1/8]
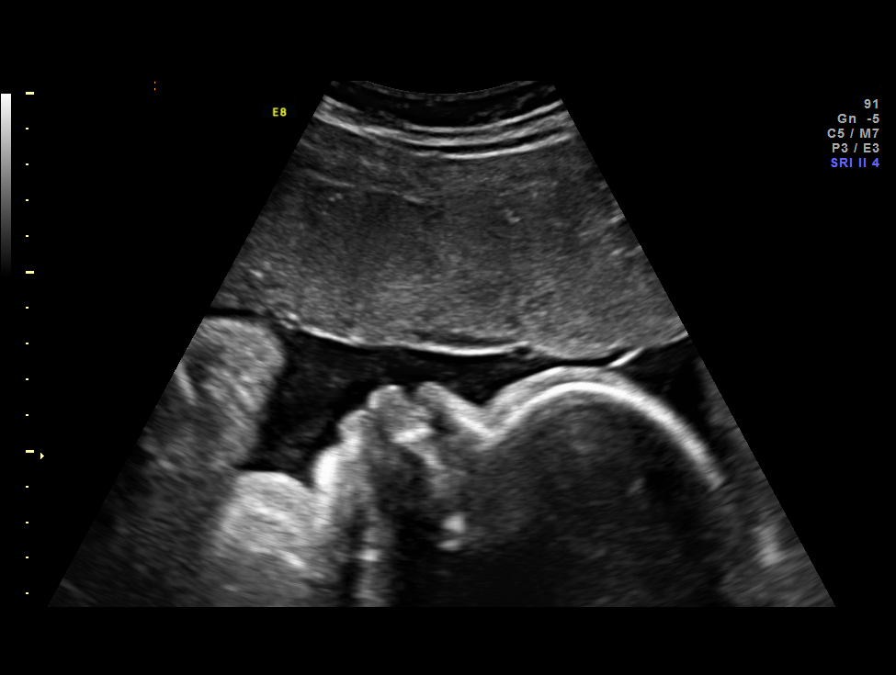
[im 2/8]
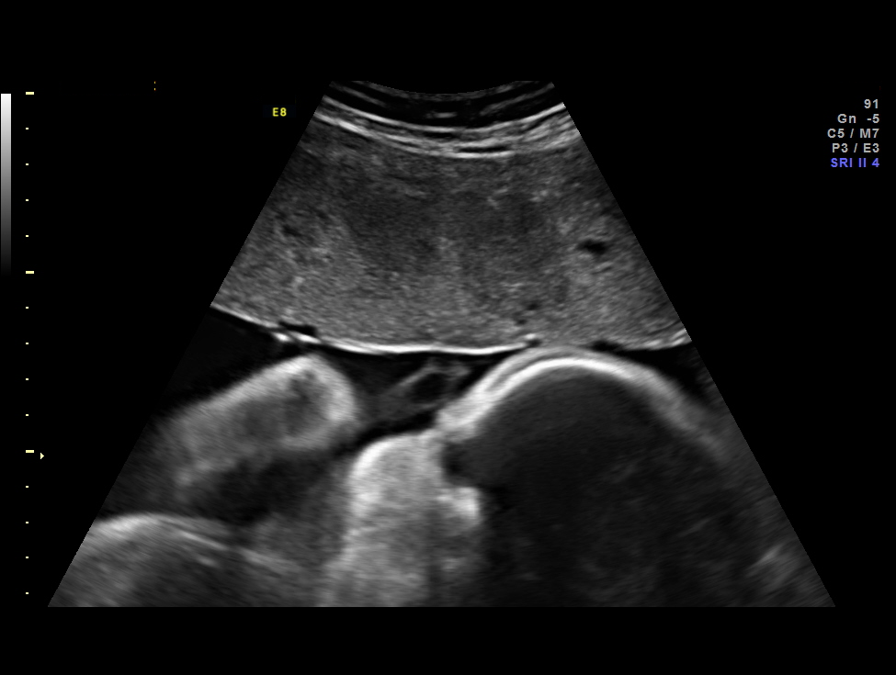
[im 3/8]
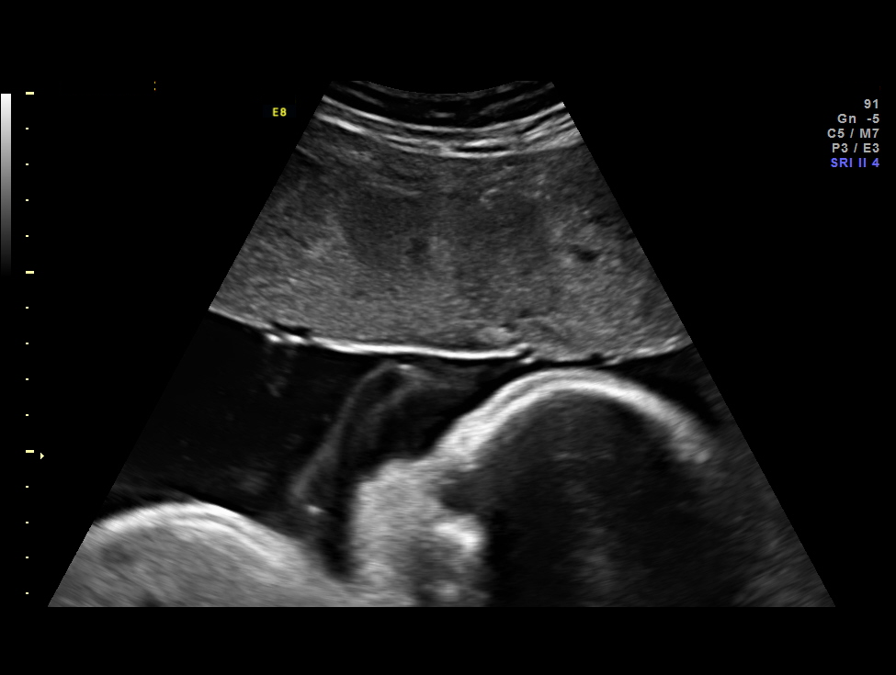
[im 4/8]
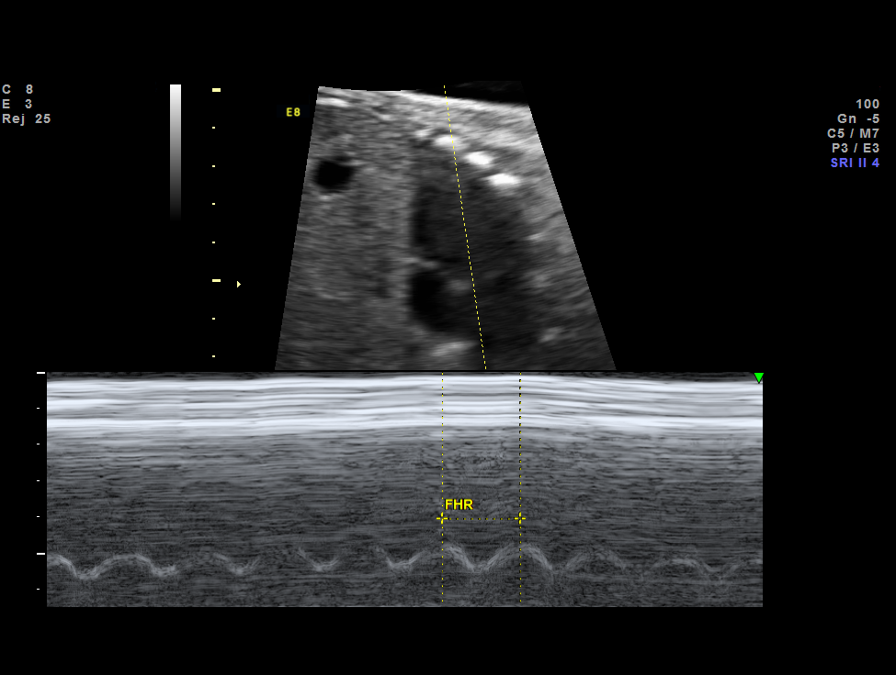
[im 5/8]
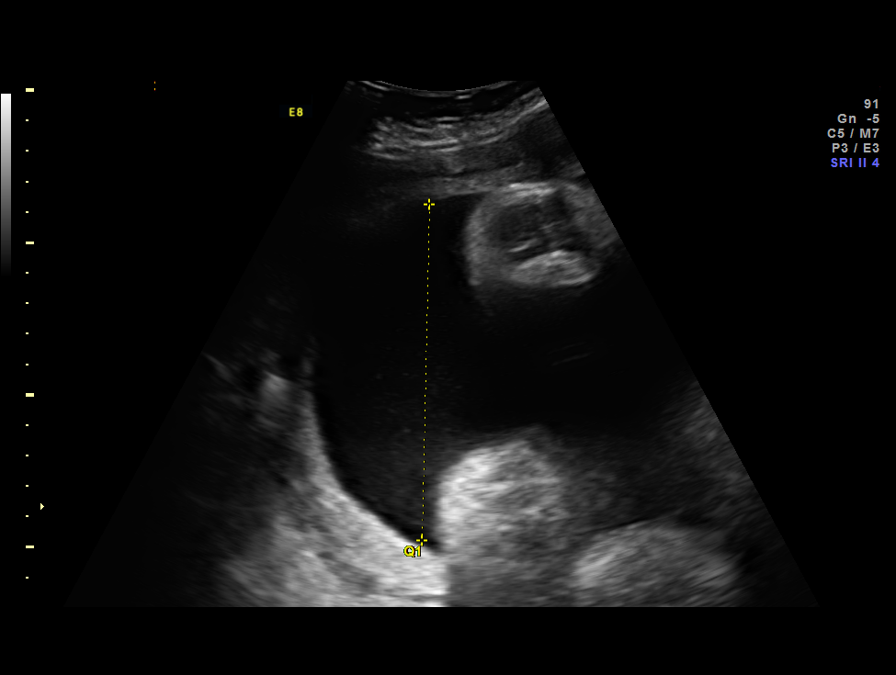
[im 6/8]
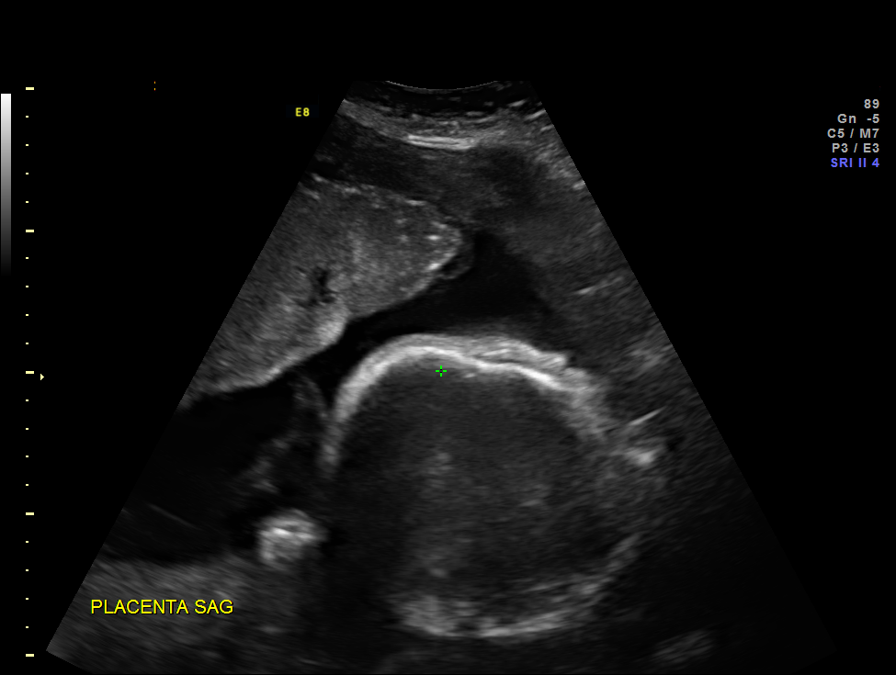
[im 7/8]
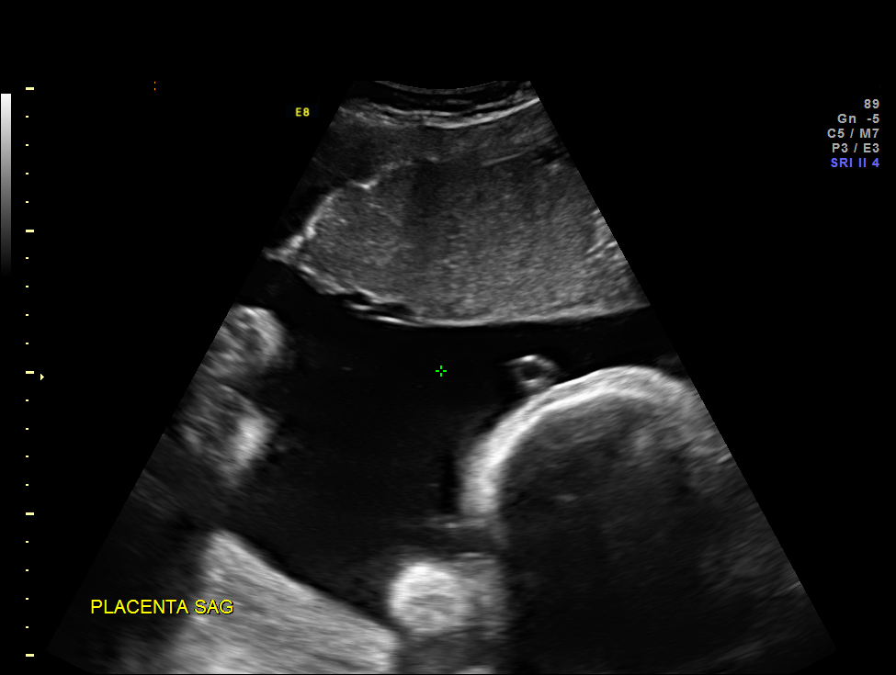
[im 8/8]
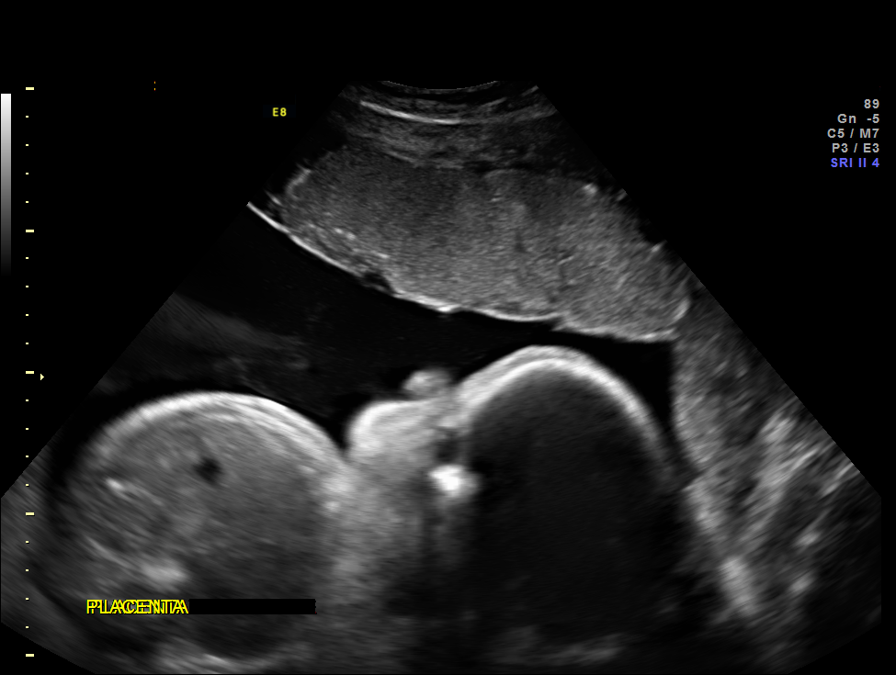

[8 of 8 positions shown; findings below may reference images not displayed]

OBSTETRICS REPORT
                      (Signed Final 03/06/2012 [DATE])

 Order#:         [id number]
Procedures

Indications

 Polyhydramnios
 Advanced maternal age (AMA), Multigravida
 Herpes simplex virus (MANCIA)
 Poor obstetric history: Previous gestational
 diabetes
 Previous cervical surgery (polypectomy)
 Uterine fibroids
Fetal Evaluation

 Fetal Heart Rate:  159                          bpm
 Cardiac Activity:  Observed
 Presentation:      Cephalic
 Placenta:          Anterior, above cervical os

 Amniotic Fluid
 AFI FV:      Mild polyhydramnios
                                             Larg Pckt:  11.05   cm
 RUQ:   11.05   cm
Biophysical Evaluation

 Amniotic F.V:   Increased                  F. Tone:        Observed
 F. Movement:    Observed                   Score:          [DATE]
 F. Breathing:   Observed
Gestational Age

 LMP:           34w 2d        Date:  07/08/11                 EDD:   04/13/12
 Best:          36w 0d     Det. By:  Previous Ultrasound      EDD:   04/01/12
                                     (10/30/11)
Impression

 IUP at 36+0 weeks
 Study limited to BPP
 BPP [DATE]
 Mild polyhydramnios
Recommendations

 Continue twice weekly NSTs with weekly AFIs or BPPs

## 2013-04-02 IMAGING — US US OB FOLLOW-UP
1 series · 14 of 24 positions shown · non-contrast
Comparison: none

[Series 1: us ob follow-up · 0.41mm/px · 24 acquisitions, 14 frames shown]
[im 1/24]
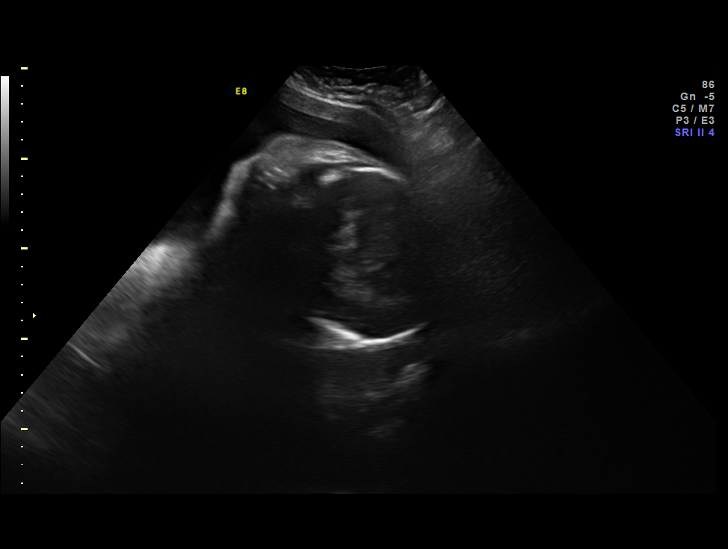
[im 3/24]
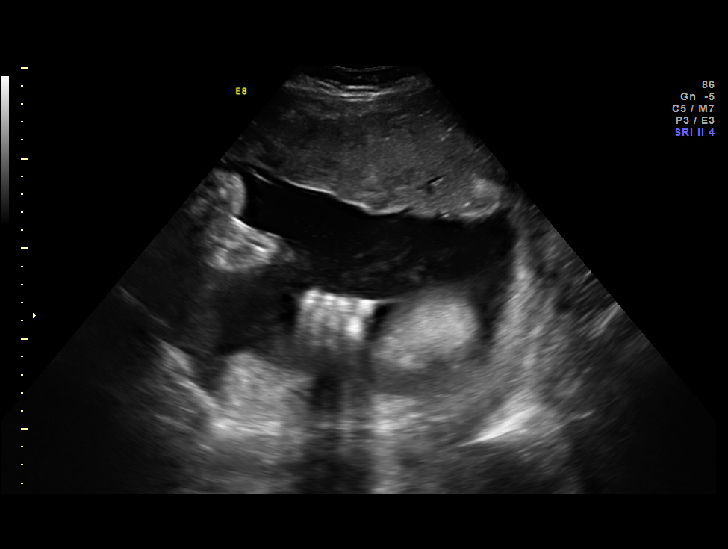
[im 5/24]
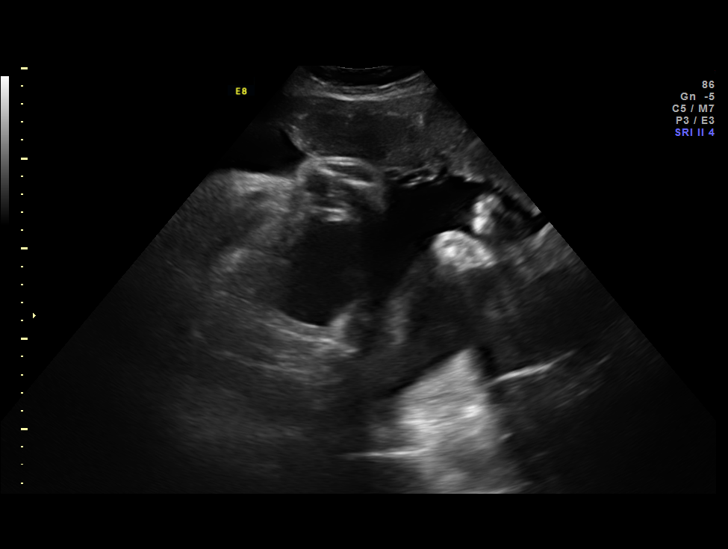
[im 7/24]
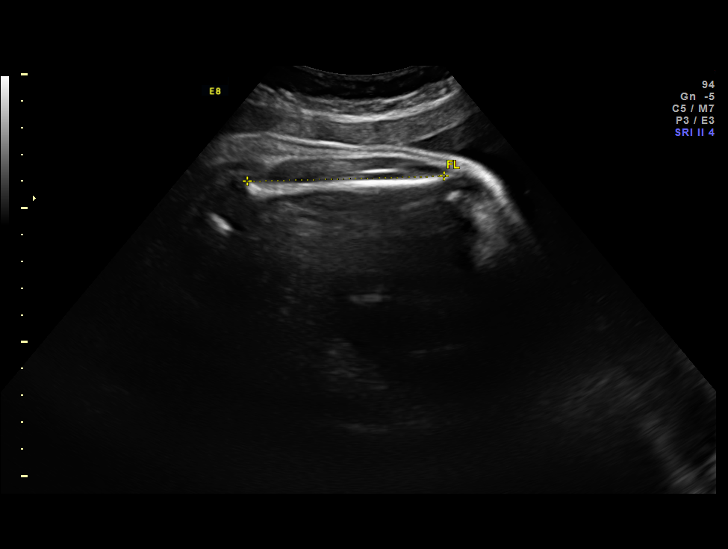
[im 8/24]
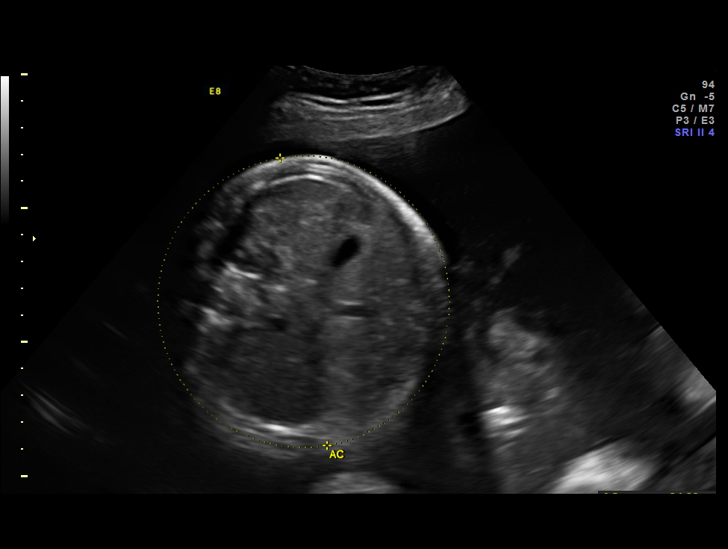
[im 10/24]
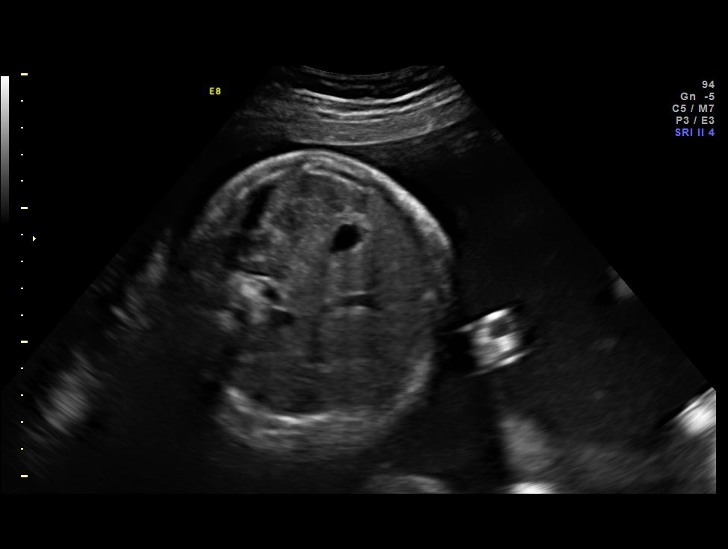
[im 12/24]
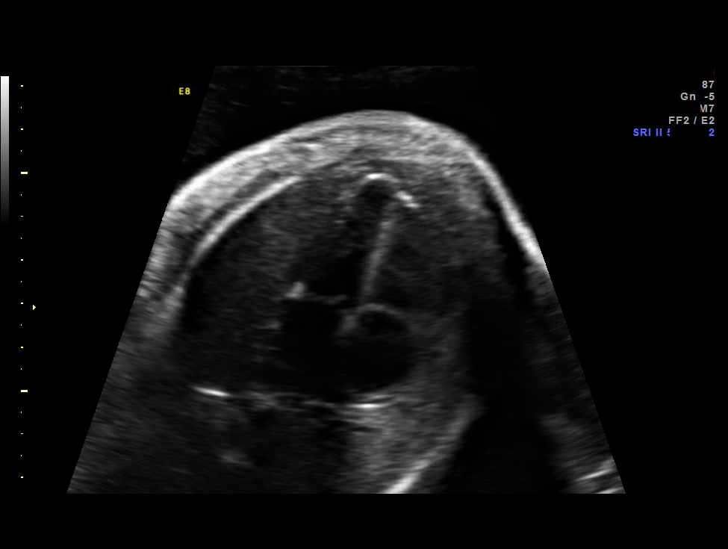
[im 13/24]
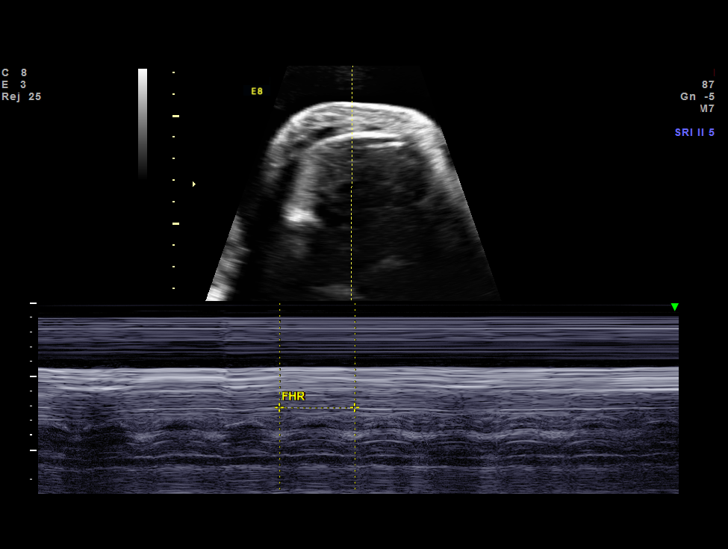
[im 15/24]
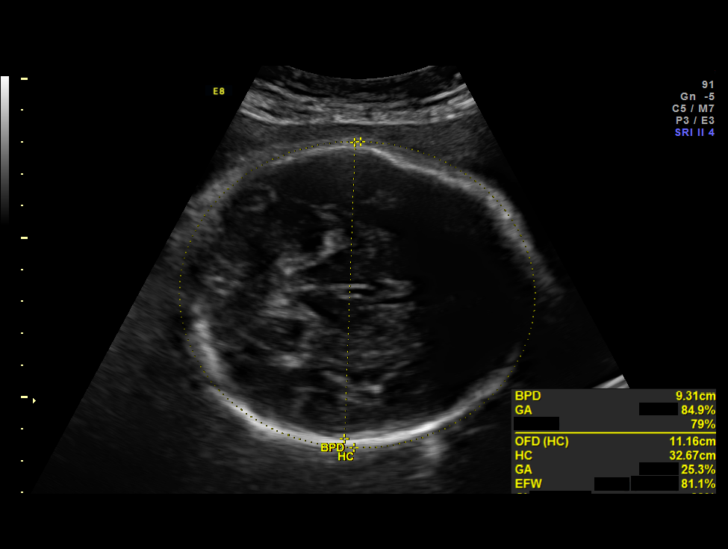
[im 17/24]
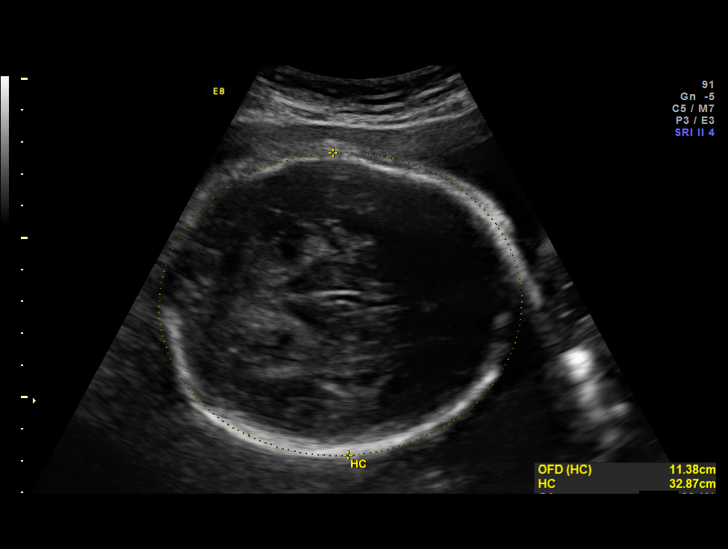
[im 19/24]
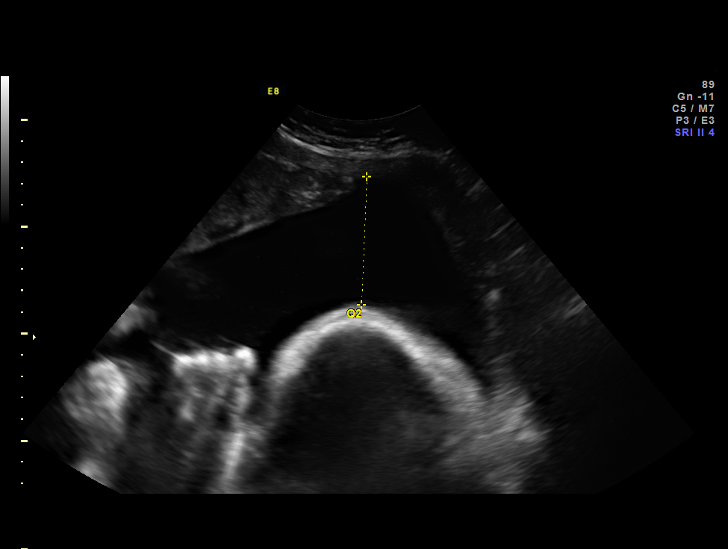
[im 20/24]
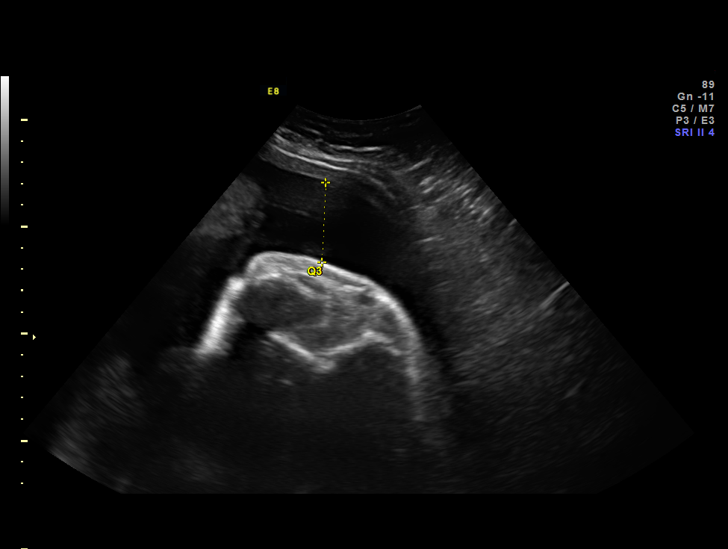
[im 22/24]
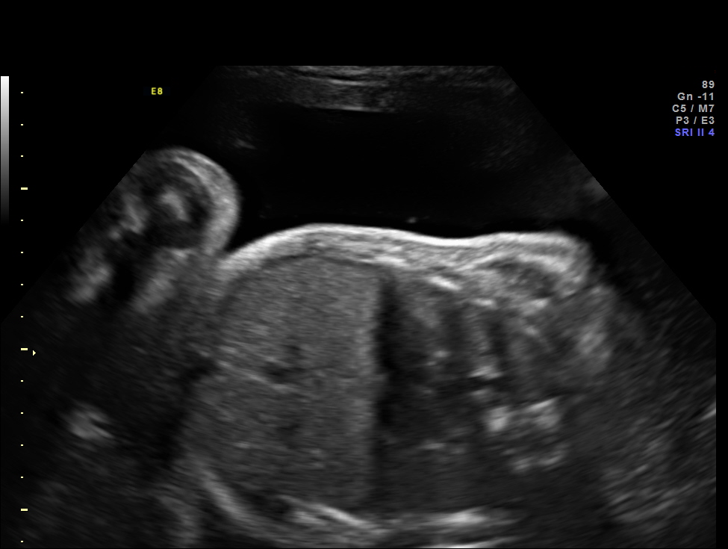
[im 24/24]
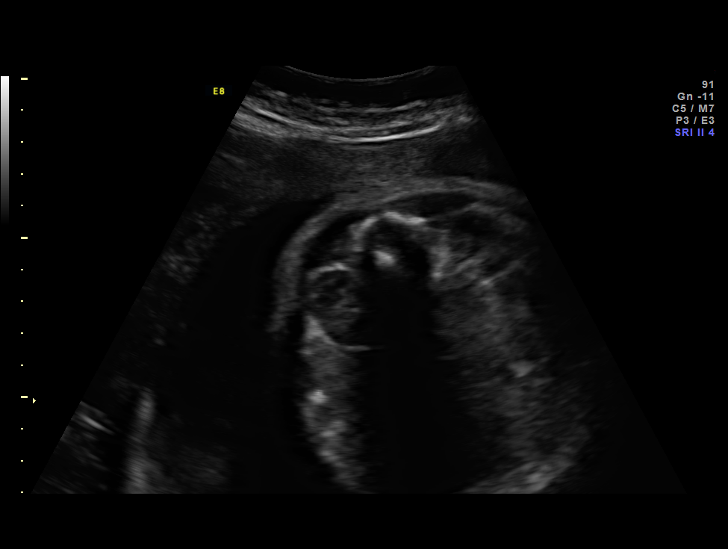

[14 of 24 positions shown; findings below may reference images not displayed]

Canned report from images found in remote index.

Refer to host system for actual result text.

## 2013-06-26 ENCOUNTER — Other Ambulatory Visit: Payer: Self-pay | Admitting: Obstetrics

## 2013-06-26 DIAGNOSIS — D219 Benign neoplasm of connective and other soft tissue, unspecified: Secondary | ICD-10-CM

## 2013-07-02 ENCOUNTER — Ambulatory Visit
Admission: RE | Admit: 2013-07-02 | Discharge: 2013-07-02 | Disposition: A | Discharge status: Home / Self Care | Payer: 59 | Source: Ambulatory Visit | Attending: Obstetrics | Admitting: Obstetrics

## 2013-07-02 DIAGNOSIS — D219 Benign neoplasm of connective and other soft tissue, unspecified: Secondary | ICD-10-CM

## 2013-07-08 ENCOUNTER — Other Ambulatory Visit: Payer: Self-pay | Admitting: Obstetrics

## 2013-07-08 DIAGNOSIS — D259 Leiomyoma of uterus, unspecified: Secondary | ICD-10-CM

## 2013-07-08 DIAGNOSIS — N920 Excessive and frequent menstruation with regular cycle: Secondary | ICD-10-CM

## 2013-07-23 ENCOUNTER — Ambulatory Visit
Admission: RE | Admit: 2013-07-23 | Discharge: 2013-07-23 | Disposition: A | Discharge status: Home / Self Care | Payer: 59 | Source: Ambulatory Visit | Attending: Obstetrics | Admitting: Obstetrics

## 2013-07-23 DIAGNOSIS — N920 Excessive and frequent menstruation with regular cycle: Secondary | ICD-10-CM

## 2013-07-23 DIAGNOSIS — D259 Leiomyoma of uterus, unspecified: Secondary | ICD-10-CM

## 2013-07-23 MED ORDER — GADOBENATE DIMEGLUMINE 529 MG/ML IV SOLN
20.0000 mL | Freq: Once | INTRAVENOUS | Status: AC | PRN
  Administered 2013-07-23: 20 mL via INTRAVENOUS

## 2013-07-28 ENCOUNTER — Other Ambulatory Visit (HOSPITAL_COMMUNITY): Payer: Self-pay | Admitting: Interventional Radiology

## 2013-07-28 DIAGNOSIS — D259 Leiomyoma of uterus, unspecified: Secondary | ICD-10-CM

## 2013-07-29 ENCOUNTER — Inpatient Hospital Stay: Admission: RE | Admit: 2013-07-29 | Payer: BC Managed Care – PPO | Source: Ambulatory Visit

## 2013-07-29 ENCOUNTER — Telehealth: Payer: Self-pay | Admitting: Interventional Radiology

## 2013-07-29 NOTE — Progress Notes (Signed)
I have reviewed Lindsay Hurley's MRI, which shows devitalized (nonenhancing) fibroids, and significant adenomyosis, a 1.7 mm junctional zone. She will not benefit from UFE, in my opinion and should seek definitive treatment such as hysterectomy. She understands and her questions were answered.

## 2013-08-25 ENCOUNTER — Other Ambulatory Visit (HOSPITAL_COMMUNITY): Payer: Self-pay | Admitting: Obstetrics

## 2013-08-25 DIAGNOSIS — Z1231 Encounter for screening mammogram for malignant neoplasm of breast: Secondary | ICD-10-CM

## 2013-10-07 ENCOUNTER — Ambulatory Visit (HOSPITAL_COMMUNITY)
Admission: RE | Admit: 2013-10-07 | Discharge: 2013-10-07 | Disposition: A | Discharge status: Home / Self Care | Payer: 59 | Source: Ambulatory Visit | Attending: Obstetrics | Admitting: Obstetrics

## 2013-10-07 DIAGNOSIS — Z1231 Encounter for screening mammogram for malignant neoplasm of breast: Secondary | ICD-10-CM | POA: Insufficient documentation

## 2013-10-14 ENCOUNTER — Other Ambulatory Visit: Payer: Self-pay | Admitting: Obstetrics

## 2013-10-14 DIAGNOSIS — R928 Other abnormal and inconclusive findings on diagnostic imaging of breast: Secondary | ICD-10-CM

## 2013-10-29 ENCOUNTER — Ambulatory Visit
Admission: RE | Admit: 2013-10-29 | Discharge: 2013-10-29 | Disposition: A | Discharge status: Home / Self Care | Payer: 59 | Source: Ambulatory Visit | Attending: Obstetrics | Admitting: Obstetrics

## 2013-10-29 DIAGNOSIS — R928 Other abnormal and inconclusive findings on diagnostic imaging of breast: Secondary | ICD-10-CM

## 2013-11-19 ENCOUNTER — Other Ambulatory Visit: Payer: Self-pay | Admitting: Obstetrics

## 2014-01-02 ENCOUNTER — Encounter (HOSPITAL_COMMUNITY): Payer: Self-pay | Admitting: Pharmacist

## 2014-01-07 ENCOUNTER — Encounter (HOSPITAL_COMMUNITY): Payer: Self-pay

## 2014-01-07 ENCOUNTER — Encounter (HOSPITAL_COMMUNITY)
Admission: RE | Admit: 2014-01-07 | Discharge: 2014-01-07 | Disposition: A | Discharge status: Home / Self Care | Payer: 59 | Source: Ambulatory Visit | Attending: Obstetrics | Admitting: Obstetrics

## 2014-01-07 DIAGNOSIS — Z01812 Encounter for preprocedural laboratory examination: Secondary | ICD-10-CM | POA: Insufficient documentation

## 2014-01-07 LAB — CBC
HEMATOCRIT: 32.5 % — AB (ref 36.0–46.0)
Hemoglobin: 10.5 g/dL — ABNORMAL LOW (ref 12.0–15.0)
MCH: 27.9 pg (ref 26.0–34.0)
MCHC: 32.3 g/dL (ref 30.0–36.0)
MCV: 86.2 fL (ref 78.0–100.0)
Platelets: 286 10*3/uL (ref 150–400)
RBC: 3.77 MIL/uL — ABNORMAL LOW (ref 3.87–5.11)
RDW: 14 % (ref 11.5–15.5)
WBC: 4.4 10*3/uL (ref 4.0–10.5)

## 2014-01-07 LAB — SURGICAL PCR SCREEN
MRSA, PCR: NEGATIVE
Staphylococcus aureus: NEGATIVE

## 2014-01-07 NOTE — Patient Instructions (Addendum)
Your procedure is scheduled on: Wednesday, January 14, 2014  Enter through the [employer] [facility] at: 6:00am  Pick up the phone at the desk and dial [phone].  Call this number if you have problems the morning of surgery: [phone].  Remember: Do NOT eat food: AFTER MIDNIGHT TUESDAY Do NOT drink clear liquids after: AFTER MIDNIGHT TUESDAY Take these medicines the morning of surgery with a SIP OF WATER: NONE  Do NOT wear jewelry (body piercing), metal hair clips/bobby pins, make-up, or nail polish. Do NOT wear lotions, powders, or perfumes.  You may wear deoderant. Do NOT shave for 48 hours prior to surgery. Do NOT bring valuables to the hospital. Contacts, dentures, or bridgework may not be worn into surgery. Leave suitcase in car.  After surgery it may be brought to your room.  For patients admitted to the hospital, checkout time is 11:00 AM the day of discharge.

## 2014-01-08 NOTE — H&P (Signed)
NAME:  NYESHIA, MYSLIWIEC                  ACCOUNT NO.:  MEDICAL RECORD NO.:  [id number]  LOCATION:                                 FACILITY:  PHYSICIAN:  Frederico Hamman, M.D.DATE OF BIRTH:  August 03, 1971  DATE OF ADMISSION: DATE OF DISCHARGE:                             HISTORY & PHYSICAL   DATE OF SURGERY:  January 14, 2014.  The patient is a 43 year old, gravida 5, para 3-0-2-3, who has a history of large fibroids in for TAH.  PAST MEDICAL HISTORY:  She had a C-section.  She also had hysteroscopy D and C, and had polyps removed, and she has also had her gallbladder removed.  The patient also has a history of abnormal Pap smears, and her colposcopy in January 15 revealed benign endocervical mucosa and atypical cells.  SOCIAL HISTORY:  Negative.  SYSTEM REVIEW:  Negative.  PHYSICAL EXAMINATION:  GENERAL:  Well-developed female, in no distress. HEENT:  Negative. LUNGS:  Clear to P and A. HEART:  Regular rhythm.  No murmurs.  No gallops. ABDOMEN:  Mass arising from the pelvis.  16-week size myomas. EXTERNAL GENITALIA:  Normal.  Vagina normal. EXTREMITIES:  Negative.          ______________________________ Frederico Hamman, M.D.     BAM/MEDQ  D:  01/08/2014  T:  01/08/2014  Job:  [id number]

## 2014-01-14 ENCOUNTER — Encounter (HOSPITAL_COMMUNITY): Payer: Self-pay | Admitting: *Deleted

## 2014-01-14 ENCOUNTER — Inpatient Hospital Stay (HOSPITAL_COMMUNITY): Payer: 59 | Admitting: Anesthesiology

## 2014-01-14 ENCOUNTER — Inpatient Hospital Stay (HOSPITAL_COMMUNITY)
Admission: RE | Admit: 2014-01-14 | Discharge: 2014-01-16 | DRG: 743 | Disposition: A | Discharge status: Home / Self Care | Payer: 59 | Source: Ambulatory Visit | Attending: Obstetrics | Admitting: Obstetrics

## 2014-01-14 ENCOUNTER — Encounter (HOSPITAL_COMMUNITY)
Admission: RE | Disposition: A | Discharge status: Home / Self Care | Payer: Self-pay | Source: Ambulatory Visit | Attending: Obstetrics

## 2014-01-14 ENCOUNTER — Encounter (HOSPITAL_COMMUNITY): Payer: 59 | Admitting: Anesthesiology

## 2014-01-14 DIAGNOSIS — Z90722 Acquired absence of ovaries, bilateral: Secondary | ICD-10-CM

## 2014-01-14 DIAGNOSIS — Z9079 Acquired absence of other genital organ(s): Secondary | ICD-10-CM

## 2014-01-14 DIAGNOSIS — D252 Subserosal leiomyoma of uterus: Secondary | ICD-10-CM | POA: Diagnosis present

## 2014-01-14 DIAGNOSIS — D259 Leiomyoma of uterus, unspecified: Secondary | ICD-10-CM

## 2014-01-14 DIAGNOSIS — N8000 Endometriosis of the uterus, unspecified: Secondary | ICD-10-CM | POA: Diagnosis present

## 2014-01-14 DIAGNOSIS — N852 Hypertrophy of uterus: Secondary | ICD-10-CM | POA: Diagnosis present

## 2014-01-14 DIAGNOSIS — Z90710 Acquired absence of both cervix and uterus: Secondary | ICD-10-CM

## 2014-01-14 DIAGNOSIS — D649 Anemia, unspecified: Secondary | ICD-10-CM | POA: Diagnosis present

## 2014-01-14 DIAGNOSIS — D251 Intramural leiomyoma of uterus: Principal | ICD-10-CM | POA: Diagnosis present

## 2014-01-14 HISTORY — PX: BILATERAL SALPINGECTOMY: SHX5743

## 2014-01-14 HISTORY — PX: ABDOMINAL HYSTERECTOMY: SHX81

## 2014-01-14 SURGERY — HYSTERECTOMY, ABDOMINAL
Anesthesia: General | Site: Abdomen

## 2014-01-14 MED ORDER — NEOSTIGMINE METHYLSULFATE 1 MG/ML IJ SOLN
INTRAMUSCULAR | Status: AC
  Filled 2014-01-14: qty 1

## 2014-01-14 MED ORDER — FENTANYL CITRATE 0.05 MG/ML IJ SOLN
25.0000 ug | INTRAMUSCULAR | Status: DC | PRN
  Administered 2014-01-14: 25 ug via INTRAVENOUS
  Administered 2014-01-14 (×3): 50 ug via INTRAVENOUS
  Administered 2014-01-14: 25 ug via INTRAVENOUS

## 2014-01-14 MED ORDER — NALOXONE HCL 0.4 MG/ML IJ SOLN: 0.4000 mg | INTRAMUSCULAR | Status: DC | PRN

## 2014-01-14 MED ORDER — LACTATED RINGERS IV SOLN
INTRAVENOUS | Status: DC
  Administered 2014-01-14 – 2014-01-15 (×3): via INTRAVENOUS

## 2014-01-14 MED ORDER — LACTATED RINGERS IV SOLN
INTRAVENOUS | Status: DC
  Administered 2014-01-14 (×3): via INTRAVENOUS

## 2014-01-14 MED ORDER — ONDANSETRON HCL 4 MG/2ML IJ SOLN
4.0000 mg | Freq: Four times a day (QID) | INTRAMUSCULAR | Status: DC | PRN
  Administered 2014-01-14: 4 mg via INTRAVENOUS
  Filled 2014-01-14: qty 2

## 2014-01-14 MED ORDER — LIDOCAINE HCL (CARDIAC) 20 MG/ML IV SOLN
INTRAVENOUS | Status: DC | PRN
  Administered 2014-01-14: 60 mg via INTRAVENOUS

## 2014-01-14 MED ORDER — FENTANYL CITRATE 0.05 MG/ML IJ SOLN
INTRAMUSCULAR | Status: AC
  Filled 2014-01-14: qty 5

## 2014-01-14 MED ORDER — MIDAZOLAM HCL 2 MG/2ML IJ SOLN
INTRAMUSCULAR | Status: AC
  Filled 2014-01-14: qty 2

## 2014-01-14 MED ORDER — ONDANSETRON HCL 4 MG/2ML IJ SOLN
INTRAMUSCULAR | Status: DC | PRN
  Administered 2014-01-14: 4 mg via INTRAVENOUS

## 2014-01-14 MED ORDER — ROCURONIUM BROMIDE 100 MG/10ML IV SOLN
INTRAVENOUS | Status: DC | PRN
  Administered 2014-01-14: 50 mg via INTRAVENOUS

## 2014-01-14 MED ORDER — GLYCOPYRROLATE 0.2 MG/ML IJ SOLN
INTRAMUSCULAR | Status: DC | PRN
  Administered 2014-01-14: 0.6 mg via INTRAVENOUS

## 2014-01-14 MED ORDER — KETOROLAC TROMETHAMINE 30 MG/ML IJ SOLN
INTRAMUSCULAR | Status: AC
  Filled 2014-01-14: qty 1

## 2014-01-14 MED ORDER — PROPOFOL 10 MG/ML IV BOLUS
INTRAVENOUS | Status: DC | PRN
  Administered 2014-01-14: 150 mg via INTRAVENOUS
  Administered 2014-01-14: 20 mg via INTRAVENOUS

## 2014-01-14 MED ORDER — ONDANSETRON HCL 4 MG/2ML IJ SOLN
INTRAMUSCULAR | Status: AC
  Filled 2014-01-14: qty 2

## 2014-01-14 MED ORDER — MEPERIDINE HCL 25 MG/ML IJ SOLN: 6.2500 mg | INTRAMUSCULAR | Status: DC | PRN

## 2014-01-14 MED ORDER — DEXAMETHASONE SODIUM PHOSPHATE 10 MG/ML IJ SOLN
INTRAMUSCULAR | Status: DC | PRN
  Administered 2014-01-14: 10 mg via INTRAVENOUS

## 2014-01-14 MED ORDER — DEXAMETHASONE SODIUM PHOSPHATE 10 MG/ML IJ SOLN
INTRAMUSCULAR | Status: AC
  Filled 2014-01-14: qty 1

## 2014-01-14 MED ORDER — METOCLOPRAMIDE HCL 5 MG/ML IJ SOLN: 10.0000 mg | Freq: Once | INTRAMUSCULAR | Status: DC | PRN

## 2014-01-14 MED ORDER — KETOROLAC TROMETHAMINE 30 MG/ML IJ SOLN: 15.0000 mg | Freq: Once | INTRAMUSCULAR | Status: DC | PRN

## 2014-01-14 MED ORDER — MIDAZOLAM HCL 5 MG/5ML IJ SOLN
INTRAMUSCULAR | Status: DC | PRN
  Administered 2014-01-14: 2 mg via INTRAVENOUS

## 2014-01-14 MED ORDER — NEOSTIGMINE METHYLSULFATE 1 MG/ML IJ SOLN
INTRAMUSCULAR | Status: DC | PRN
  Administered 2014-01-14: 3 mg via INTRAVENOUS

## 2014-01-14 MED ORDER — CLINDAMYCIN PHOSPHATE 900 MG/50ML IV SOLN
900.0000 mg | Freq: Once | INTRAVENOUS | Status: AC
  Administered 2014-01-14: 900 mg via INTRAVENOUS
  Filled 2014-01-14: qty 50

## 2014-01-14 MED ORDER — IBUPROFEN 800 MG PO TABS
800.0000 mg | ORAL_TABLET | Freq: Three times a day (TID) | ORAL | Status: DC | PRN
  Administered 2014-01-15 – 2014-01-16 (×3): 800 mg via ORAL
  Filled 2014-01-14 (×3): qty 1

## 2014-01-14 MED ORDER — LIDOCAINE HCL (CARDIAC) 20 MG/ML IV SOLN
INTRAVENOUS | Status: AC
  Filled 2014-01-14: qty 5

## 2014-01-14 MED ORDER — FENTANYL CITRATE 0.05 MG/ML IJ SOLN
INTRAMUSCULAR | Status: AC
  Filled 2014-01-14: qty 2

## 2014-01-14 MED ORDER — ACETAMINOPHEN 160 MG/5ML PO SOLN
975.0000 mg | Freq: Once | ORAL | Status: AC
  Administered 2014-01-14: 975 mg via ORAL

## 2014-01-14 MED ORDER — HYDROMORPHONE HCL PF 1 MG/ML IJ SOLN
INTRAMUSCULAR | Status: DC | PRN
  Administered 2014-01-14 (×2): 0.5 mg via INTRAVENOUS

## 2014-01-14 MED ORDER — KETOROLAC TROMETHAMINE 30 MG/ML IJ SOLN
30.0000 mg | Freq: Four times a day (QID) | INTRAMUSCULAR | Status: DC
  Filled 2014-01-14: qty 1

## 2014-01-14 MED ORDER — DIPHENHYDRAMINE HCL 50 MG/ML IJ SOLN
12.5000 mg | Freq: Four times a day (QID) | INTRAMUSCULAR | Status: DC | PRN
Start: 2014-01-14 — End: 2014-01-16

## 2014-01-14 MED ORDER — FENTANYL CITRATE 0.05 MG/ML IJ SOLN
INTRAMUSCULAR | Status: DC | PRN
  Administered 2014-01-14 (×2): 100 ug via INTRAVENOUS
  Administered 2014-01-14: 50 ug via INTRAVENOUS

## 2014-01-14 MED ORDER — ACETAMINOPHEN 160 MG/5ML PO SOLN
ORAL | Status: AC
  Filled 2014-01-14: qty 40.6

## 2014-01-14 MED ORDER — ACETAMINOPHEN 160 MG/5ML PO SOLN
ORAL | Status: AC
  Administered 2014-01-14: 975 mg via ORAL
  Filled 2014-01-14: qty 40.6

## 2014-01-14 MED ORDER — KETOROLAC TROMETHAMINE 30 MG/ML IJ SOLN
INTRAMUSCULAR | Status: DC | PRN
  Administered 2014-01-14: 30 mg via INTRAVENOUS

## 2014-01-14 MED ORDER — SODIUM CHLORIDE 0.9 % IJ SOLN: 9.0000 mL | INTRAMUSCULAR | Status: DC | PRN

## 2014-01-14 MED ORDER — DIPHENHYDRAMINE HCL 12.5 MG/5ML PO ELIX: 12.5000 mg | ORAL_SOLUTION | Freq: Four times a day (QID) | ORAL | Status: DC | PRN

## 2014-01-14 MED ORDER — OXYCODONE-ACETAMINOPHEN 5-325 MG PO TABS
1.0000 | ORAL_TABLET | ORAL | Status: DC | PRN
  Administered 2014-01-15 (×2): 2 via ORAL
  Administered 2014-01-15 – 2014-01-16 (×3): 1 via ORAL
  Filled 2014-01-14 (×4): qty 1
  Filled 2014-01-14 (×2): qty 2

## 2014-01-14 MED ORDER — KETOROLAC TROMETHAMINE 30 MG/ML IJ SOLN: 30.0000 mg | Freq: Once | INTRAMUSCULAR | Status: DC

## 2014-01-14 MED ORDER — 0.9 % SODIUM CHLORIDE (POUR BTL) OPTIME
TOPICAL | Status: DC | PRN
  Administered 2014-01-14: 1000 mL

## 2014-01-14 MED ORDER — KETOROLAC TROMETHAMINE 30 MG/ML IJ SOLN
30.0000 mg | Freq: Four times a day (QID) | INTRAMUSCULAR | Status: DC
  Administered 2014-01-14 – 2014-01-15 (×3): 30 mg via INTRAVENOUS
  Filled 2014-01-14 (×3): qty 1

## 2014-01-14 MED ORDER — PROPOFOL 10 MG/ML IV EMUL
INTRAVENOUS | Status: AC
  Filled 2014-01-14: qty 20

## 2014-01-14 MED ORDER — HYDROMORPHONE HCL PF 1 MG/ML IJ SOLN
INTRAMUSCULAR | Status: AC
  Filled 2014-01-14: qty 1

## 2014-01-14 MED ORDER — HYDROMORPHONE HCL PF 1 MG/ML IJ SOLN
0.2000 mg | INTRAMUSCULAR | Status: DC | PRN
  Administered 2014-01-14 (×2): 0.6 mg via INTRAVENOUS
  Administered 2014-01-14: 0.5 mg via INTRAVENOUS
  Administered 2014-01-14 – 2014-01-15 (×2): 0.6 mg via INTRAVENOUS
  Filled 2014-01-14 (×5): qty 1

## 2014-01-14 MED ORDER — GLYCOPYRROLATE 0.2 MG/ML IJ SOLN
INTRAMUSCULAR | Status: AC
  Filled 2014-01-14: qty 4

## 2014-01-14 SURGICAL SUPPLY — 33 items
CANISTER SUCT 3000ML (MISCELLANEOUS) ×4 IMPLANT
CHLORAPREP W/TINT 26ML (MISCELLANEOUS) ×4 IMPLANT
CLOTH BEACON ORANGE TIMEOUT ST (SAFETY) ×4 IMPLANT
DECANTER SPIKE VIAL GLASS SM (MISCELLANEOUS) IMPLANT
DERMABOND ADVANCED (GAUZE/BANDAGES/DRESSINGS) ×2
DERMABOND ADVANCED .7 DNX12 (GAUZE/BANDAGES/DRESSINGS) ×2 IMPLANT
DRAPE WARM FLUID 44X44 (DRAPE) IMPLANT
GAUZE SPONGE 4X4 16PLY XRAY LF (GAUZE/BANDAGES/DRESSINGS) ×4 IMPLANT
GLOVE BIO SURGEON STRL SZ8.5 (GLOVE) ×4 IMPLANT
GOWN STRL REUS W/TWL 2XL LVL3 (GOWN DISPOSABLE) ×4 IMPLANT
GOWN STRL REUS W/TWL LRG LVL3 (GOWN DISPOSABLE) ×12 IMPLANT
NEEDLE HYPO 25X1 1.5 SAFETY (NEEDLE) IMPLANT
NS IRRIG 1000ML POUR BTL (IV SOLUTION) ×4 IMPLANT
PACK ABDOMINAL GYN (CUSTOM PROCEDURE TRAY) ×4 IMPLANT
PAD OB MATERNITY 4.3X12.25 (PERSONAL CARE ITEMS) ×4 IMPLANT
PROTECTOR NERVE ULNAR (MISCELLANEOUS) ×4 IMPLANT
SPONGE LAP 18X18 X RAY DECT (DISPOSABLE) ×8 IMPLANT
STAPLER VISISTAT 35W (STAPLE) IMPLANT
SUT CHROMIC 0 CT 1 (SUTURE) ×4 IMPLANT
SUT CHROMIC 1 CT1 27 (SUTURE) ×8 IMPLANT
SUT CHROMIC 1MO 4 18 CR8 (SUTURE) ×8 IMPLANT
SUT CHROMIC 2 0 CT 1 (SUTURE) ×4 IMPLANT
SUT CHROMIC GUT AB #0 18 (SUTURE) ×4 IMPLANT
SUT MON AB 4-0 PS1 27 (SUTURE) ×4 IMPLANT
SUT PDS AB 0 CT 36 (SUTURE) IMPLANT
SUT VIC AB 0 CT1 18XCR BRD8 (SUTURE) IMPLANT
SUT VIC AB 0 CT1 27 (SUTURE) ×2
SUT VIC AB 0 CT1 27XBRD ANBCTR (SUTURE) ×2 IMPLANT
SUT VIC AB 0 CT1 8-18 (SUTURE)
SYR CONTROL 10ML LL (SYRINGE) IMPLANT
TOWEL OR 17X24 6PK STRL BLUE (TOWEL DISPOSABLE) ×8 IMPLANT
TRAY FOLEY CATH 14FR (SET/KITS/TRAYS/PACK) ×4 IMPLANT
WATER STERILE IRR 1000ML POUR (IV SOLUTION) IMPLANT

## 2014-01-14 NOTE — Anesthesia Postprocedure Evaluation (Signed)
  Anesthesia Post-op Note  Patient: Lindsay Hurley  Procedure(s) Performed: Procedure(s): HYSTERECTOMY ABDOMINAL TOTAL (N/A) BILATERAL SALPINGECTOMY (Bilateral)  Patient Location: PACU  Anesthesia Type:General  Level of Consciousness: awake, alert  and oriented  Airway and Oxygen Therapy: Patient Spontanous Breathing  Post-op Pain: mild  Post-op Assessment: Post-op Vital signs reviewed, Patient's Cardiovascular Status Stable, Respiratory Function Stable, Patent Airway, No signs of Nausea or vomiting and Pain level controlled  Post-op Vital Signs: Reviewed and stable  Complications: No apparent anesthesia complications

## 2014-01-14 NOTE — Op Note (Signed)
Rehabilitation diagnosis myoma uteri and adenomyosis Anesthesia Gen. Surgeon Dr. Gracy Racer First assistant Dr. Baltazar Najjar Procedure patient in the supine position after general anesthesia administered abdomen prepped and draped and vagina prepped Foley catheter inserted a transverse suprapubic incision made carried down to the rectus fascia fascia cleaned and incised the length of the incision recti muscles retracted laterally peritoneum incised longitudinally the uterus was enlarged with multiple myomas and was exteriorized ovaries appeared normal the right round ligament was grasped with a Kelly clamp cut and suture ligated #1 chromic procedure done on  The other side  a similar fashion this utero-ovarian ligament grasped  right cut and suture ligated   done in a similar fashion on   The  side the uterine vessels were skeletonized bilaterally the   right and then on the left using the Metzenbaum scissors the bladder was dissected off of the cervix and pushed off of the cervix at this point using a scalpel the uterus was separated from the cervix cervix grasped straight cochleas and on the right cardinal uterosacral ligaments grasped traight cochleas cut suture-ligated normal on chromic procedure done in a similar fashion minutes cervix was removed at the cervicovaginal junction modified Richardson sutures placed in a him hemostasis satisfactory the left tube uture-ligated #1 chromic procedure done in a similar fashion  On the the ovaries were normal lap and sponge counts correct abdomen closed in layers peritoneum continuous with of 0 chromic fascia continuous with of 0 Dexon and the skin shows a subcuticular stitch of 404 0 Monocryl blood loss was 150 cc and and

## 2014-01-14 NOTE — Addendum Note (Signed)
Addendum created 01/14/14 2131 by Asher Muir, CRNA   Modules edited: Notes Section   Notes Section:  File: [id number]

## 2014-01-14 NOTE — H&P (Signed)
There has been no change in the history and physical from the time of the original dictation

## 2014-01-14 NOTE — Anesthesia Preprocedure Evaluation (Signed)
Anesthesia Evaluation  Patient identified by MRN, date of birth, ID band Patient awake    Reviewed: Allergy & Precautions, H&P , NPO status , Patient's Chart, lab work & pertinent test results, reviewed documented beta blocker date and time   History of Anesthesia Complications Negative for: history of anesthetic complications  Airway Mallampati: II TM Distance: >3 FB Neck ROM: full    Dental  (+) Teeth Intact   Pulmonary neg pulmonary ROS,  breath sounds clear to auscultation  Pulmonary exam normal       Cardiovascular Exercise Tolerance: Good negative cardio ROS  Rhythm:regular Rate:Normal     Neuro/Psych negative neurological ROS  negative psych ROS   GI/Hepatic negative GI ROS, Neg liver ROS,   Endo/Other  BMI 36.9  Renal/GU negative Renal ROS  Female GU complaint     Musculoskeletal   Abdominal   Peds  Hematology  (+) anemia ,   Anesthesia Other Findings   Reproductive/Obstetrics negative OB ROS                           Anesthesia Physical Anesthesia Plan  ASA: II  Anesthesia Plan: General ETT   Post-op Pain Management:    Induction:   Airway Management Planned:   Additional Equipment:   Intra-op Plan:   Post-operative Plan:   Informed Consent: I have reviewed the patients History and Physical, chart, labs and discussed the procedure including the risks, benefits and alternatives for the proposed anesthesia with the patient or authorized representative who has indicated his/her understanding and acceptance.   Dental Advisory Given  Plan Discussed with: CRNA and Surgeon  Anesthesia Plan Comments:         Anesthesia Quick Evaluation

## 2014-01-14 NOTE — Anesthesia Postprocedure Evaluation (Signed)
Anesthesia Post Note  Patient: Lindsay Hurley  Procedure(s) Performed: Procedure(s) (LRB): HYSTERECTOMY ABDOMINAL TOTAL (N/A) BILATERAL SALPINGECTOMY (Bilateral)  Anesthesia type: General  Patient location: [facility]  Post pain: Pain level controlled  Post assessment: Post-op Vital signs reviewed  Last Vitals:  Filed Vitals:   01/14/14 2103  BP: 114/62  Pulse: 68  Temp: 36.8 C  Resp: 16    Post vital signs: Reviewed  Level of consciousness: sedated  Complications: No apparent anesthesia complications

## 2014-01-14 NOTE — Transfer of Care (Signed)
Immediate Anesthesia Transfer of Care Note  Patient: Lindsay Hurley  Procedure(s) Performed: Procedure(s): HYSTERECTOMY ABDOMINAL TOTAL (N/A) BILATERAL SALPINGECTOMY (Bilateral)  Patient Location: PACU  Anesthesia Type:General  Level of Consciousness: sedated  Airway & Oxygen Therapy: Patient Spontanous Breathing and Patient connected to nasal cannula oxygen  Post-op Assessment: Report given to PACU RN and Post -op Vital signs reviewed and stable  Post vital signs: stable  Complications: No apparent anesthesia complications

## 2014-01-15 ENCOUNTER — Encounter (HOSPITAL_COMMUNITY): Payer: Self-pay | Admitting: Obstetrics

## 2014-01-15 LAB — CBC
HCT: 28 % — ABNORMAL LOW (ref 36.0–46.0)
HEMOGLOBIN: 8.9 g/dL — AB (ref 12.0–15.0)
MCH: 27.5 pg (ref 26.0–34.0)
MCHC: 31.8 g/dL (ref 30.0–36.0)
MCV: 86.4 fL (ref 78.0–100.0)
Platelets: 220 10*3/uL (ref 150–400)
RBC: 3.24 MIL/uL — ABNORMAL LOW (ref 3.87–5.11)
RDW: 14.2 % (ref 11.5–15.5)
WBC: 11.7 10*3/uL — AB (ref 4.0–10.5)

## 2014-01-15 NOTE — Progress Notes (Signed)
Patient ID: Lindsay Hurley, female   DOB: 11/19/1970, 43 y.o.   MRN: [id number] Post op day one  vital signs normal Up and About Good bowel sounds regular diet today and

## 2014-01-16 NOTE — Discharge Summary (Signed)
  Patient was admitted because of myomas and adenomyosis and on 325 underwent a TAH and bilateral salpingectomy postop she has done well and will be discharged today to see me in 4 weeks and and

## 2014-01-16 NOTE — Progress Notes (Signed)
Pt discharged home with husband... Condition stable... No equipment.. Ambulated to car with Lindsay Hurley, NT.

## 2014-01-16 NOTE — Discharge Instructions (Signed)
Hysterectomy, Abdominal  °Care After °Please read the instructions below. Refer to these instructions for the next few weeks. These instructions provide you with general information on caring for yourself after surgery. Your caregiver may also give you specific instructions. While your treatment has been planned according to the most current medical practices available, unavoidable problems sometimes happen. If you have any problems or questions after you leave, please call your caregiver. °HOME CARE INSTRUCTIONS  °Healing will take time. You will have discomfort, tenderness, swelling and bruising at the operative site for a couple of weeks. This is normal and will get better as time goes on.  °· Only take over-the-counter or prescription medicines for pain, discomfort or fever as directed by your caregiver.  °· Do not take aspirin. It can cause bleeding.  °· Do not drive when taking pain medication.  °· Follow your caregiver's advice regarding diet, exercise, lifting, driving and general activities.  °· Resume your usual diet as directed and allowed.  °· Get plenty of rest and sleep.  °· Do not douche, use tampons, or have sexual intercourse until your caregiver gives you permission.  °· Change your bandages (dressings) as directed.  °· Take your temperature twice a day. Write it down.  °· Your caregiver may recommend showers instead of baths for a few weeks.  °· Do not drink alcohol until your caregiver gives you permission.  °· If you develop constipation, you may take a mild laxative with your caregiver's permission. Bran foods and drinking fluids helps with constipation problems.  °· Try to have someone home with you for a week or two to help with the household activities.  °· Make sure you and your family understands everything about your operation and recovery.  °· Do not sign any legal documents until you feel normal again.  °· Keep all your follow-up appointments as recommended by your caregiver.  °SEEK  MEDICAL CARE IF:  °· There is swelling, redness or increasing pain in the wound area.  °· Pus is coming from the wound.  °· You notice a bad smell from the wound or surgical dressing.  °· You have pain, redness and swelling from the intravenous site.  °· The wound is breaking open (the edges are not staying together).  °· You feel dizzy or feel like fainting.  °· You develop pain or bleeding when you urinate.  °· You develop diarrhea.  °· You develop nausea and vomiting.  °· You develop abnormal vaginal discharge.  °· You develop a rash.  °· You have any type of abnormal reaction or develop an allergy to your medication.  °· You need stronger pain medication for your pain.  °SEEK IMMEDIATE MEDICAL CARE IF: °· You have a fever.  °· You develop abdominal pain.  °· You develop chest pain.  °· You develop shortness of breath.  °· You pass out.  °· You develop pain, swelling or redness of your leg.  °· You develop heavy vaginal bleeding with or without blood clots.  °Document Released: 04/28/2005 Document Revised: 04/14/2011 Document Reviewed: 07/11/2009 °[employer]® Patient Information ©2012 [employer]. °

## 2014-01-16 NOTE — Progress Notes (Signed)
Patient ID: Lindsay Hurley, female   DOB: 1971-03-22, 43 y.o.   MRN: [id number] Postop day 2 Vital signs normal Incision clean and dry No vaginal bleeding passing flatus discharge today on Percocet one every 4 out to see me in 4 weeks discharge diagnosis status post TAH bilateral salpingectomy and

## 2014-03-23 ENCOUNTER — Other Ambulatory Visit: Payer: Self-pay | Admitting: Obstetrics

## 2014-03-23 DIAGNOSIS — N63 Unspecified lump in unspecified breast: Secondary | ICD-10-CM

## 2014-03-31 ENCOUNTER — Ambulatory Visit
Admission: RE | Admit: 2014-03-31 | Discharge: 2014-03-31 | Disposition: A | Discharge status: Home / Self Care | Payer: 59 | Source: Ambulatory Visit | Attending: Obstetrics | Admitting: Obstetrics

## 2014-03-31 DIAGNOSIS — N63 Unspecified lump in unspecified breast: Secondary | ICD-10-CM

## 2014-08-24 ENCOUNTER — Encounter (HOSPITAL_COMMUNITY): Payer: Self-pay | Admitting: Obstetrics

## 2014-09-03 ENCOUNTER — Other Ambulatory Visit: Payer: Self-pay | Admitting: Obstetrics

## 2014-09-03 DIAGNOSIS — N63 Unspecified lump in unspecified breast: Secondary | ICD-10-CM

## 2014-10-20 ENCOUNTER — Ambulatory Visit
Admission: RE | Admit: 2014-10-20 | Discharge: 2014-10-20 | Disposition: A | Discharge status: Home / Self Care | Payer: BC Managed Care – PPO | Source: Ambulatory Visit | Attending: Obstetrics | Admitting: Obstetrics

## 2014-10-20 DIAGNOSIS — N63 Unspecified lump in unspecified breast: Secondary | ICD-10-CM

## 2015-01-13 ENCOUNTER — Institutional Professional Consult (permissible substitution): Payer: Self-pay | Admitting: Neurology

## 2015-01-18 ENCOUNTER — Encounter: Payer: Self-pay | Admitting: Neurology

## 2015-01-18 ENCOUNTER — Institutional Professional Consult (permissible substitution): Payer: BLUE CROSS/BLUE SHIELD | Admitting: Neurology

## 2015-11-10 ENCOUNTER — Other Ambulatory Visit: Payer: Self-pay

## 2015-11-10 DIAGNOSIS — Z1231 Encounter for screening mammogram for malignant neoplasm of breast: Secondary | ICD-10-CM

## 2015-11-24 ENCOUNTER — Ambulatory Visit
Admission: RE | Admit: 2015-11-24 | Discharge: 2015-11-24 | Disposition: A | Discharge status: Home / Self Care | Payer: BLUE CROSS/BLUE SHIELD | Source: Ambulatory Visit

## 2015-11-24 DIAGNOSIS — Z1231 Encounter for screening mammogram for malignant neoplasm of breast: Secondary | ICD-10-CM

## 2016-04-06 ENCOUNTER — Emergency Department (HOSPITAL_COMMUNITY)
Admission: EM | Admit: 2016-04-06 | Discharge: 2016-04-06 | Disposition: A | Discharge status: Home / Self Care | Payer: BLUE CROSS/BLUE SHIELD | Attending: Emergency Medicine | Admitting: Emergency Medicine

## 2016-04-06 ENCOUNTER — Emergency Department (HOSPITAL_COMMUNITY): Payer: BLUE CROSS/BLUE SHIELD

## 2016-04-06 ENCOUNTER — Encounter (HOSPITAL_COMMUNITY): Payer: Self-pay | Admitting: Emergency Medicine

## 2016-04-06 DIAGNOSIS — R200 Anesthesia of skin: Secondary | ICD-10-CM

## 2016-04-06 DIAGNOSIS — Z79899 Other long term (current) drug therapy: Secondary | ICD-10-CM | POA: Insufficient documentation

## 2016-04-06 DIAGNOSIS — R079 Chest pain, unspecified: Secondary | ICD-10-CM | POA: Diagnosis present

## 2016-04-06 LAB — RAPID URINE DRUG SCREEN, HOSP PERFORMED
Amphetamines: NOT DETECTED
Barbiturates: NOT DETECTED
Benzodiazepines: NOT DETECTED
Cocaine: NOT DETECTED
OPIATES: NOT DETECTED
Tetrahydrocannabinol: NOT DETECTED

## 2016-04-06 LAB — APTT: APTT: 34 s (ref 24–37)

## 2016-04-06 LAB — URINE MICROSCOPIC-ADD ON

## 2016-04-06 LAB — URINALYSIS, ROUTINE W REFLEX MICROSCOPIC
Bilirubin Urine: NEGATIVE
Glucose, UA: NEGATIVE mg/dL
Ketones, ur: NEGATIVE mg/dL
LEUKOCYTES UA: NEGATIVE
NITRITE: NEGATIVE
PH: 6 (ref 5.0–8.0)
Protein, ur: NEGATIVE mg/dL
SPECIFIC GRAVITY, URINE: 1.016 (ref 1.005–1.030)

## 2016-04-06 LAB — CBC
HEMATOCRIT: 36.4 % (ref 36.0–46.0)
HEMOGLOBIN: 12.1 g/dL (ref 12.0–15.0)
MCH: 29.1 pg (ref 26.0–34.0)
MCHC: 33.2 g/dL (ref 30.0–36.0)
MCV: 87.5 fL (ref 78.0–100.0)
Platelets: 247 10*3/uL (ref 150–400)
RBC: 4.16 MIL/uL (ref 3.87–5.11)
RDW: 12.7 % (ref 11.5–15.5)
WBC: 4.9 10*3/uL (ref 4.0–10.5)

## 2016-04-06 LAB — BASIC METABOLIC PANEL
ANION GAP: 6 (ref 5–15)
BUN: 10 mg/dL (ref 6–20)
CHLORIDE: 104 mmol/L (ref 101–111)
CO2: 26 mmol/L (ref 22–32)
Calcium: 8.8 mg/dL — ABNORMAL LOW (ref 8.9–10.3)
Creatinine, Ser: 0.77 mg/dL (ref 0.44–1.00)
GFR calc Af Amer: >60 mL/min (ref >60)
GLUCOSE: 91 mg/dL (ref 65–99)
POTASSIUM: 3.8 mmol/L (ref 3.5–5.1)
Sodium: 136 mmol/L (ref 135–145)

## 2016-04-06 LAB — I-STAT TROPONIN, ED: Troponin i, poc: 0 ng/mL (ref 0.00–0.08)

## 2016-04-06 LAB — PROTIME-INR
INR: 1.14 (ref 0.00–1.49)
PROTHROMBIN TIME: 14.3 s (ref 11.6–15.2)

## 2016-04-06 LAB — ETHANOL

## 2016-04-06 LAB — TROPONIN I: Troponin I: <0.03 ng/mL (ref <0.031)

## 2016-04-06 MED ORDER — SODIUM CHLORIDE 0.9 % IV BOLUS (SEPSIS)
500.0000 mL | Freq: Once | INTRAVENOUS | Status: AC
  Administered 2016-04-06: 500 mL via INTRAVENOUS

## 2016-04-06 MED ORDER — LORAZEPAM 2 MG/ML IJ SOLN
1.0000 mg | INTRAMUSCULAR | Status: DC | PRN
Start: 2016-04-06 — End: 2016-04-06

## 2016-04-06 MED ORDER — SODIUM CHLORIDE 0.9 % IV SOLN: 100.0000 mL/h | INTRAVENOUS | Status: DC

## 2016-04-06 NOTE — ED Provider Notes (Signed)
CSN: [id number]     Arrival date & time 04/06/16  1128 History   First MD Initiated Contact with Patient 04/06/16 1148     Chief Complaint  Patient presents with  . Chest Pain  . Numbness  . Facial Swelling     (Consider location/radiation/quality/duration/timing/severity/associated sxs/prior Treatment) HPI She presents with concern of left facial swelling, asymmetry, and left arm numbness per Onset was earlier today, though exact time is unclear, the patient may have had some dysesthesia previously in the left face. No clear precipitant. Since onset symptoms actually improved, with no more discernible facial numbness, arm numbness. Earlier the patient's husband suggested that she had facial asymmetry as well, though she is unsure if this was true. Currently she denies any complaints, including pain, confusion, disorientation, vision changes. Patient denies any substantial medical problems. Patient does not smoke, does not drink.  Past Medical History  Diagnosis Date  . Herpes genitalia   . Anemia    Past Surgical History  Procedure Laterality Date  . Cervical polypectomy    . Hysteroscopy    . Cesarean section  03/26/2012    Procedure: CESAREAN SECTION;  Surgeon: Frederico Hamman, MD;  Location: [facility];  Service: Gynecology;  Laterality: N/A;  . Ercp N/A 11/29/2012    Procedure: ENDOSCOPIC RETROGRADE CHOLANGIOPANCREATOGRAPHY (ERCP);  Surgeon: Ladene Artist, MD,FACG;  Location: [facility];  Service: Endoscopy;  Laterality: N/A;  . Cholecystectomy N/A 11/29/2012    Procedure: LAPAROSCOPIC CHOLECYSTECTOMY WITH INTRAOPERATIVE CHOLANGIOGRAM;  Surgeon: Ralene Ok, MD;  Location: [facility];  Service: General;  Laterality: N/A;  . Tubal ligation    . Abdominal hysterectomy N/A 01/14/2014    Procedure: HYSTERECTOMY ABDOMINAL TOTAL;  Surgeon: Frederico Hamman, MD;  Location: [facility];  Service: Gynecology;  Laterality: N/A;  . Bilateral salpingectomy Bilateral 01/14/2014    Procedure:  BILATERAL SALPINGECTOMY;  Surgeon: Frederico Hamman, MD;  Location: [facility];  Service: Gynecology;  Laterality: Bilateral;   Family History  Problem Relation Age of Onset  . Anesthesia problems Neg Hx   . Asthma Brother   . Diabetes Maternal Aunt   . Stroke Maternal Aunt   . Alcohol abuse Maternal Uncle   . Drug abuse Maternal Uncle   . Cancer Maternal Uncle   . Alcohol abuse Paternal Uncle   . Drug abuse Paternal Uncle   . Cancer Maternal Grandmother     Lung  . COPD Maternal Grandfather   . Depression Cousin   . Cancer Father     Throat  . Alcohol abuse Father    Social History  Substance Use Topics  . Smoking status: Never Smoker   . Smokeless tobacco: Never Used  . Alcohol Use: No   OB History    Gravida Para Term Preterm AB TAB SAB Ectopic Multiple Living   5 3 3  0 2 1 1  0 0 3     Review of Systems  Constitutional:       Per HPI, otherwise negative  HENT:       Per HPI, otherwise negative  Respiratory:       Per HPI, otherwise negative  Cardiovascular:       Per HPI, otherwise negative  Gastrointestinal: Negative for vomiting.  Endocrine:       Negative aside from HPI  Genitourinary:       Neg aside from HPI   Musculoskeletal:       Per HPI, otherwise negative  Skin: Negative.   Neurological: Positive for numbness.  Negative for syncope.      Allergies  Penicillins  Home Medications   Prior to Admission medications   Medication Sig Start Date End Date Taking? Authorizing Provider  gabapentin (NEURONTIN) 300 MG capsule Take 300 mg by mouth 2 (two) times daily as needed (pain).   Yes Historical Provider, MD   BP 113/80 mmHg  Pulse 69  Temp(Src) 98.8 F (37.1 C) (Oral)  Resp 17  Ht 5\' 3"  (1.6 m)  Wt 225 lb (102.059 kg)  BMI 39.87 kg/m2  SpO2 100%  LMP 11/16/2015 Physical Exam  Constitutional: She is oriented to person, place, and time. She appears well-developed and well-nourished. No distress.  HENT:  Head: Normocephalic and atraumatic.   Eyes: Conjunctivae and EOM are normal.  Cardiovascular: Normal rate and regular rhythm.   Pulmonary/Chest: Effort normal and breath sounds normal. No stridor. No respiratory distress.  Abdominal: She exhibits no distension.  Musculoskeletal: She exhibits no edema.  Neurological: She is alert and oriented to person, place, and time. She displays no atrophy and no tremor. No cranial nerve deficit or sensory deficit. She exhibits normal muscle tone. She displays no seizure activity. Coordination normal.  Skin: Skin is warm and dry.  Psychiatric: She has a normal mood and affect.  Nursing note and vitals reviewed.   ED Course  Procedures (including critical care time) Labs Review Labs Reviewed  BASIC METABOLIC PANEL - Abnormal; Notable for the following:    Calcium 8.8 (*)    All other components within normal limits  URINALYSIS, ROUTINE W REFLEX MICROSCOPIC (NOT AT [facility]) - Abnormal; Notable for the following:    APPearance CLOUDY (*)    Hgb urine dipstick TRACE (*)    All other components within normal limits  URINE MICROSCOPIC-ADD ON - Abnormal; Notable for the following:    Squamous Epithelial / LPF 0-5 (*)    Bacteria, UA MANY (*)    All other components within normal limits  CBC  APTT  ETHANOL  TROPONIN I  PROTIME-INR  URINE RAPID DRUG SCREEN, HOSP PERFORMED  I-STAT TROPOININ, ED    Imaging Review Dg Chest 2 View  04/06/2016  CLINICAL DATA:  Chest pain. EXAM: CHEST  2 VIEW COMPARISON:  None. FINDINGS: The heart size and mediastinal contours are within normal limits. Both lungs are clear. No pneumothorax or pleural effusion is noted. The visualized skeletal structures are unremarkable. IMPRESSION: No active cardiopulmonary disease. Electronically Signed   By: Marijo Conception, M.D.   On: 04/06/2016 12:25   Ct Head Wo Contrast  04/06/2016  CLINICAL DATA:  Left arm numbness, left-sided facial swelling, lightheadedness, dizziness, headache. Rule out stroke. EXAM: CT HEAD WITHOUT  CONTRAST TECHNIQUE: Contiguous axial images were obtained from the base of the skull through the vertex without intravenous contrast. COMPARISON:  Ventricles are normal in size and configuration. All areas of the brain demonstrate appropriate gray-white matter differentiation. There is no mass, hemorrhage, edema or other evidence of acute parenchymal abnormality. No extra-axial hemorrhage. FINDINGS: Brain: Ventricles are normal in size and configuration. All areas of the brain demonstrate appropriate gray-white matter differentiation. There is no mass, hemorrhage, edema or other evidence of acute parenchymal abnormality. No extra-axial hemorrhage. Vascular: No hyperdense vessel or unexpected calcification. Skull: Negative for fracture or focal lesion. Sinuses/Orbits: No acute findings. Other: None. IMPRESSION: Normal head CT. These results were called by telephone at the time of interpretation on 04/06/2016 at 12:46 pm to Dr. Carmin Muskrat , who verbally acknowledged these results. Electronically Signed  By: Franki Cabot M.D.   On: 04/06/2016 12:47   Mr Brain Wo Contrast  04/06/2016  CLINICAL DATA:  45 year old female with left facial and upper extremity numbness. Intermittent chest pain. Initial encounter. EXAM: MRI HEAD WITHOUT CONTRAST TECHNIQUE: Multiplanar, multiecho pulse sequences of the brain and surrounding structures were obtained without intravenous contrast. COMPARISON:  Head CT without contrast 1235 hours today. FINDINGS: Cerebral volume is normal. No restricted diffusion to suggest acute infarction. No midline shift, mass effect, evidence of mass lesion, ventriculomegaly, extra-axial collection or acute intracranial hemorrhage. Cervicomedullary junction and pituitary are within normal limits. Major intracranial vascular flow voids appear normal. Pearline Cables and white matter signal is within normal limits throughout the brain. No encephalomalacia or chronic cerebral blood products identified. Visible  internal auditory structures appear normal. Mastoids are clear. Paranasal sinuses are clear aside from occasional tiny mucous retention cysts. Orbit and scalp soft tissues are within normal limits. Negative visualized cervical spine. Visualized bone marrow signal is within normal limits. IMPRESSION: Normal noncontrast MRI appearance of the brain. Electronically Signed   By: Genevie Ann M.D.   On: 04/06/2016 14:28   I have personally reviewed and evaluated these images and lab results as part of my medical decision-making.   EKG Interpretation   Date/Time:  Thursday April 06 2016 11:46:42 EDT Ventricular Rate:  65 PR Interval:  171 QRS Duration: 104 QT Interval:  425 QTC Calculation: 442 R Axis:   23 Text Interpretation:  Sinus rhythm Low voltage, precordial leads  Borderline ECG Confirmed by Carmin Muskrat  MD ([id number]) on 04/06/2016  11:50:16 AM Also confirmed by Carmin Muskrat  MD ([id number]), editor WALKER,  CCT, [place] ([id number])  on 04/06/2016 2:07:52 PM     On repeat exam the patient is in no distress. We discussed all findings at length, including reassuring MRI, CT, low suspicion of stroke. With the patient's description of prior left facial numbness, left arm dysesthesia, she will follow-up with neurology for additional evaluation. We discussed prophylaxis with aspirin or Plavix, patient deferred this pending further outpatient evaluation. MDM   Final diagnoses:  Facial numbness   Patient presents after left facial and left arm numbness that occurred earlier today. Here patient's evaluation is reassuring, with no evidence for ongoing stroke, ACS or other acute new pathology. Given the patient's description of substantial symptoms she will follow-up with neurology. With no evidence for systemic disease, infection, neurologic dysfunction, she is appropriate for further evaluation, management as an outpatient.  Carmin Muskrat, MD 04/06/16 [phone]

## 2016-04-06 NOTE — ED Notes (Signed)
Patient transported to MRI 

## 2016-04-06 NOTE — ED Notes (Signed)
Patient states that on the way over here her left arm began to feel numb. Also, left-sided facial swelling. Reports over the past week that she has had inmittent "sharp shooting pain in her chest" Patient alert, oriented, and speaking in full sentences. Ambulatory to room.

## 2016-04-06 NOTE — Discharge Instructions (Signed)
As discussed, your evaluation today has been largely reassuring.  But, it is important that you monitor your condition carefully, and do not hesitate to return to the ED if you develop new, or concerning changes in your condition.  Otherwise, please be sure to follow-up with our neurology colleagues to complete your evaluation. If you do not receive a call the office tomorrow, please be sure to call to arrange appointment within the next week.

## 2016-04-06 NOTE — ED Notes (Signed)
Nurse at bedside collecting labs 

## 2016-04-06 NOTE — ED Notes (Signed)
PT DISCHARGED. INSTRUCTIONS GIVEN. AAOX4. PT IN NO APPARENT DISTRESS OR PAIN. THE OPPORTUNITY TO ASK QUESTIONS WAS PROVIDED. 

## 2016-05-25 ENCOUNTER — Other Ambulatory Visit (INDEPENDENT_AMBULATORY_CARE_PROVIDER_SITE_OTHER): Payer: BLUE CROSS/BLUE SHIELD

## 2016-05-25 ENCOUNTER — Ambulatory Visit (INDEPENDENT_AMBULATORY_CARE_PROVIDER_SITE_OTHER): Payer: BLUE CROSS/BLUE SHIELD | Admitting: Neurology

## 2016-05-25 ENCOUNTER — Encounter: Payer: Self-pay | Admitting: Neurology

## 2016-05-25 VITALS — BP 110/80 | HR 62 | Ht 64.0 in | Wt 229.1 lb

## 2016-05-25 DIAGNOSIS — E669 Obesity, unspecified: Secondary | ICD-10-CM | POA: Diagnosis not present

## 2016-05-25 DIAGNOSIS — R202 Paresthesia of skin: Secondary | ICD-10-CM

## 2016-05-25 LAB — VITAMIN B12: Vitamin B-12: 255 pg/mL (ref 211–911)

## 2016-05-25 LAB — SEDIMENTATION RATE: Sed Rate: 7 mm/hr (ref 0–20)

## 2016-05-25 LAB — TSH: TSH: 1.18 u[IU]/mL (ref 0.35–4.50)

## 2016-05-25 NOTE — Patient Instructions (Signed)
Great to see you today.  We will check some basic labs to be sure there is no other reason for your symptoms. If your symptoms return, please come back and see me. To maintain a healthy lifestyle, I recommend starting an exercise prgram and low-sugar/carb and fat diet

## 2016-05-25 NOTE — Progress Notes (Signed)
[facility] Clinic Note - Initial Visit   Date: 05/25/16  Lindsay Hurley MRN: [id number] DOB: September 30, 1971   Dear Dr. Ruthann Cancer:  Thank you for your kind referral of Lindsay Hurley for consultation of left sided paresthesias. Although her history is well known to you, please allow [place] to reiterate it for the purpose of our medical record. The patient was accompanied to the clinic by self.    History of Present Illness: Lindsay Hurley is a 45 y.o. left-handed African American female with no significant medical history presenting for evaluation of left face and arm paresthesias.    She woke up on June 15th and was getting ready when her husband that that her face looked asymmetrical, which lasted about an hour.  While on the way to the ER, she began having left arm numbness.  This lasted several hours and improved by evening.  She went to [facility] ER on 6/15 where CT and MRI brain was normal.  The previous day she was having substernal shooting pain, which occurred about twice, lasting a few seconds. There was no chest pressure.  She had mild dizziness, but feels she was dehydrated.   She reports having a very mild headache, but nothing that she needed to take anything for.  She had chronic numbness of the right jaw, which was due to old tooth extraction.  Her maternal aunt had a stroke (49s), otherwise no immediate family member with stroke or heart disease.  She does not have history of tobacco use, hypertension, diabetes, or hyperlipidemia.    Out-side paper records, electronic medical record, and images have been reviewed where available and summarized as:  CT brain 04/06/2016: normal MRI brain wo contrast 04/06/2016:  Normal   Lab Results  Component Value Date   NA 136 04/06/2016   K 3.8 04/06/2016   CL 104 04/06/2016   CO2 26 04/06/2016    Past Medical History:  Diagnosis Date  . Anemia   . Herpes genitalia     Past Surgical History:    Procedure Laterality Date  . ABDOMINAL HYSTERECTOMY N/A 01/14/2014   Procedure: HYSTERECTOMY ABDOMINAL TOTAL;  Surgeon: Frederico Hamman, MD;  Location: [facility];  Service: Gynecology;  Laterality: N/A;  . BILATERAL SALPINGECTOMY Bilateral 01/14/2014   Procedure: BILATERAL SALPINGECTOMY;  Surgeon: Frederico Hamman, MD;  Location: [facility];  Service: Gynecology;  Laterality: Bilateral;  . CERVICAL POLYPECTOMY    . CESAREAN SECTION  03/26/2012   Procedure: CESAREAN SECTION;  Surgeon: Frederico Hamman, MD;  Location: [facility];  Service: Gynecology;  Laterality: N/A;  . CHOLECYSTECTOMY N/A 11/29/2012   Procedure: LAPAROSCOPIC CHOLECYSTECTOMY WITH INTRAOPERATIVE CHOLANGIOGRAM;  Surgeon: Ralene Ok, MD;  Location: [facility];  Service: General;  Laterality: N/A;  . ERCP N/A 11/29/2012   Procedure: ENDOSCOPIC RETROGRADE CHOLANGIOPANCREATOGRAPHY (ERCP);  Surgeon: Ladene Artist, MD,FACG;  Location: [facility];  Service: Endoscopy;  Laterality: N/A;  . HYSTEROSCOPY    . TUBAL LIGATION       Medications:  Outpatient Encounter Prescriptions as of 05/25/2016  Medication Sig Note  . [DISCONTINUED] gabapentin (NEURONTIN) 300 MG capsule Take 300 mg by mouth 2 (two) times daily as needed (pain). 04/06/2016: Given for tooth ache by dentist.   No facility-administered encounter medications on file as of 05/25/2016.      Allergies:  Allergies  Allergen Reactions  . Penicillins Hives    Has patient had a PCN reaction causing immediate rash, facial/tongue/throat swelling, SOB or lightheadedness with  hypotension: unknown Has patient had a PCN reaction causing severe rash involving mucus membranes or skin necrosis: unknown Has patient had a PCN reaction that required hospitalization: drs visit Has patient had a PCN reaction occurring within the last 10 years: no  If all of the above answers are "NO", then may proceed with Cephalosporin use.     Family History: Family History  Problem Relation Age of Onset   . Alcohol abuse Father   . Throat cancer Father   . Hypertension Mother   . Asthma Brother   . Diabetes Maternal Aunt   . Stroke Maternal Aunt   . Alcohol abuse Maternal Uncle   . Drug abuse Maternal Uncle   . Cancer Maternal Uncle   . Alcohol abuse Paternal Uncle   . Drug abuse Paternal Uncle   . Cancer Maternal Grandmother     Lung  . COPD Maternal Grandfather   . Depression Cousin   . Healthy Son   . Healthy Daughter   . Anesthesia problems Neg Hx     Social History: Social History  Substance Use Topics  . Smoking status: Never Smoker  . Smokeless tobacco: Never Used  . Alcohol use No   Social History   Social History Narrative   Occasional caffeine use.  Lives with husband and 1 of her children in a one story home.  Has 3 children.     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     Education: college.    Review of Systems:  CONSTITUTIONAL: No fevers, chills, night sweats, or weight loss.   EYES: No visual changes or eye pain ENT: No hearing changes.  No history of nose bleeds.   RESPIRATORY: No cough, wheezing and shortness of breath.   CARDIOVASCULAR: Negative for chest pain, and palpitations.   GI: Negative for abdominal discomfort, blood in stools or black stools.  No recent change in bowel habits.   GU:  No history of incontinence.   MUSCLOSKELETAL: No history of joint pain or swelling.  No myalgias.   SKIN: Negative for lesions, rash, and itching.   HEMATOLOGY/ONCOLOGY: Negative for prolonged bleeding, bruising easily, and swollen nodes.  No history of cancer.   ENDOCRINE: Negative for cold or heat intolerance, polydipsia or goiter.   PSYCH:  No depression or anxiety symptoms.   NEURO: As Above.   Vital Signs:  BP 110/80   Pulse 62   Ht '5\' 4"'  (1.626 m)   Wt 229 lb 1 oz (103.9 kg)   LMP 11/16/2015   SpO2 97%   BMI 39.32 kg/m     General Medical Exam:   General:  Well appearing, comfortable.   Eyes/ENT: see cranial nerve examination.   Neck:  No masses appreciated.  Full range of motion without tenderness.  No carotid bruits. Respiratory:  Clear to auscultation, good air entry bilaterally.   Cardiac:  Regular rate and rhythm, no murmur.   Extremities:  No deformities, edema, or skin discoloration.  Skin:  No rashes or lesions.  Neurological Exam: MENTAL STATUS including orientation to time, place, person, recent and remote memory, attention span and concentration, language, and fund of knowledge is normal.  Speech is not dysarthric.  CRANIAL NERVES: II:  No visual field defects.  Unremarkable fundi.   III-IV-VI: Pupils equal round and reactive to light.  Normal conjugate, extra-ocular eye movements in all directions of gaze.  No nystagmus.  No ptosis.   V:  Normal facial sensation.  Jaw jerk is absent.  VII:  Normal facial symmetry and movements. Facial muscles 5/5.  No pathologic facial reflexes.  VIII:  Normal hearing and vestibular function.   IX-X:  Normal palatal movement.   XI:  Normal shoulder shrug and head rotation.   XII:  Normal tongue strength and range of motion, no deviation or fasciculation.  MOTOR:  No atrophy, fasciculations or abnormal movements.  No pronator drift.  Tone is normal.    Right Upper Extremity:    Left Upper Extremity:    Deltoid  5/5   Deltoid  5/5   Biceps  5/5   Biceps  5/5   Triceps  5/5   Triceps  5/5   Wrist extensors  5/5   Wrist extensors  5/5   Wrist flexors  5/5   Wrist flexors  5/5   Finger extensors  5/5   Finger extensors  5/5   Finger flexors  5/5   Finger flexors  5/5   Dorsal interossei  5/5   Dorsal interossei  5/5   Abductor pollicis  5/5   Abductor pollicis  5/5   Tone (Ashworth scale)  0  Tone (Ashworth scale)  0   Right Lower Extremity:    Left Lower Extremity:    Hip flexors  5/5   Hip flexors  5/5   Hip extensors  5/5   Hip extensors  5/5   Knee flexors  5/5   Knee flexors  5/5   Knee extensors  5/5   Knee extensors  5/5   Dorsiflexors  5/5   Dorsiflexors  5/5     Plantarflexors  5/5   Plantarflexors  5/5   Toe extensors  5/5   Toe extensors  5/5   Toe flexors  5/5   Toe flexors  5/5   Tone (Ashworth scale)  0  Tone (Ashworth scale)  0   MSRs:  Right                                                                 Left brachioradialis 2+  brachioradialis 2+  biceps 2+  biceps 2+  triceps 2+  triceps 2+  patellar 2+  patellar 2+  ankle jerk 2+  ankle jerk 2+  Hoffman no  Hoffman no  plantar response down  plantar response down   SENSORY:  Normal and symmetric perception of light touch, pinprick, vibration, and proprioception.  Romberg's sign absent.   COORDINATION/GAIT: Normal finger-to- nose-finger and heel-to-shin.  Intact rapid alternating movements bilaterally.  Able to rise from a chair without using arms.  Gait narrow based and stable. Tandem and stressed gait intact.    IMPRESSION: Mrs. Dewing is a delightful 45 year-old female with transient episode of left facial asymmetry and left arm numbness.  Her previous work-up included MRI brain which is normal.  Her exam today is also normal.  I reassured patient that I do not see anything worrisome causing her symptoms.  Sensory cervical radiculopathy is possible, but would expect her to have neck pain.  Basic labs such as ESR, TSH, and vitamin B12 will be checked to screen for other disorders causing paresthesias.    I also discussed healthy lifestyle changes to prevent the risk of cardiovascular disease, including minimize regular sodas, low carb/fat diet, and  starting an exercise program.    Return to clinic as needed  The duration of this appointment visit was 40 minutes of face-to-face time with the patient.  Greater than 50% of this time was spent in counseling, explanation of diagnosis, planning of further management, and coordination of care.   Thank you for allowing me to participate in patient's care.  If I can answer any additional questions, I would be pleased to do so.     Sincerely,    Carmencita Cusic K. Posey Pronto, DO

## 2016-07-17 ENCOUNTER — Encounter: Payer: Self-pay | Admitting: *Deleted

## 2016-07-17 LAB — PROCEDURE REPORT - SCANNED: Pap: ABNORMAL — AB

## 2016-10-19 ENCOUNTER — Other Ambulatory Visit: Payer: Self-pay | Admitting: Family

## 2016-10-19 DIAGNOSIS — Z1231 Encounter for screening mammogram for malignant neoplasm of breast: Secondary | ICD-10-CM

## 2016-11-28 ENCOUNTER — Ambulatory Visit
Admission: RE | Admit: 2016-11-28 | Discharge: 2016-11-28 | Disposition: A | Discharge status: Home / Self Care | Payer: BLUE CROSS/BLUE SHIELD | Source: Ambulatory Visit | Attending: Family | Admitting: Family

## 2016-11-28 DIAGNOSIS — Z1231 Encounter for screening mammogram for malignant neoplasm of breast: Secondary | ICD-10-CM

## 2017-08-23 ENCOUNTER — Ambulatory Visit: Payer: BLUE CROSS/BLUE SHIELD | Admitting: Podiatry

## 2017-08-30 ENCOUNTER — Ambulatory Visit (INDEPENDENT_AMBULATORY_CARE_PROVIDER_SITE_OTHER): Payer: BLUE CROSS/BLUE SHIELD

## 2017-08-30 ENCOUNTER — Encounter: Payer: Self-pay | Admitting: Podiatry

## 2017-08-30 ENCOUNTER — Ambulatory Visit (INDEPENDENT_AMBULATORY_CARE_PROVIDER_SITE_OTHER): Payer: BLUE CROSS/BLUE SHIELD | Admitting: Podiatry

## 2017-08-30 DIAGNOSIS — M722 Plantar fascial fibromatosis: Secondary | ICD-10-CM | POA: Diagnosis not present

## 2017-08-30 DIAGNOSIS — Q828 Other specified congenital malformations of skin: Secondary | ICD-10-CM

## 2017-08-30 MED ORDER — MELOXICAM 15 MG PO TABS: 15.0000 mg | ORAL_TABLET | Freq: Every day | ORAL | 0 refills | Status: AC

## 2017-08-30 NOTE — Progress Notes (Signed)
Subjective:    Patient ID: Lindsay Hurley, female    DOB: Feb 09, 1971, 46 y.o.   MRN: [id number]  HPI  46 year old female presents the office today for concerns of bilateral heel pain which is been ongoing since June or July of this year.  She states that she describes a throbbing sensation of the bottom of her heel at times which is worse in the morning or after periods of rest or after she is on her feet all day.  She was getting a physical by her primary care physician and she was given stretching exercises as well as rolling a ball on her foot which did help however the pain does come back.  She denies any numbness or tingling.  She denies any swelling or redness.  The pain does not wake her up at night.  Also over the last month she started to develop a hard area on her skin on the left foot along the arch area.  Denies stepping on any foreign objects denies any swelling or redness or any drainage.  She has no other concerns today.   Review of Systems  All other systems reviewed and are negative.  Past Medical History:  Diagnosis Date  . Anemia   . Herpes genitalia     Past Surgical History:  Procedure Laterality Date  . CERVICAL POLYPECTOMY    . HYSTEROSCOPY    . TUBAL LIGATION       Current Outpatient Medications:  .  meloxicam (MOBIC) 15 MG tablet, Take 1 tablet (15 mg total) daily by mouth., Disp: 30 tablet, Rfl: 0  Allergies  Allergen Reactions  . Penicillins Hives    Has patient had a PCN reaction causing immediate rash, facial/tongue/throat swelling, SOB or lightheadedness with hypotension: unknown Has patient had a PCN reaction causing severe rash involving mucus membranes or skin necrosis: unknown Has patient had a PCN reaction that required hospitalization: drs visit Has patient had a PCN reaction occurring within the last 10 years: no  If all of the above answers are "NO", then may proceed with Cephalosporin use.     Social History   Socioeconomic History    . Marital status: Married    Spouse name: Not on file  . Number of children: 3  . Years of education: Not on file  . Highest education level: Not on file  Social Needs  . Financial resource strain: Not on file  . Food insecurity - worry: Not on file  . Food insecurity - inability: Not on file  . Transportation needs - medical: Not on file  . Transportation needs - non-medical: Not on file  Occupational History  . Occupation: Biomedical scientist  Tobacco Use  . Smoking status: Never Smoker  . Smokeless tobacco: Never Used  Substance and Sexual Activity  . Alcohol use: No  . Drug use: No  . Sexual activity: Yes    Comment: tubal ligation  Other Topics Concern  . Not on file  Social History Narrative   Occasional caffeine use.  Lives with husband and 1 of her children in a one story home.  Has 3 children.     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     Education: college.         Objective:   Physical Exam General: AAO x3, NAD  Dermatological: Hyperkeratotic lesion lateral aspect the left foot along the midfoot.  This appears to be a porokeratosis.  There is no underlying ulceration, drainage or  any clinical signs of infection present.  There is no other open lesions or pre-ulcerative lesions identified.  Vascular: Dorsalis Pedis artery and Posterior Tibial artery pedal pulses are 2/4 bilateral with immedate capillary fill time. Pedal hair growth present. No varicosities and no lower extremity edema present bilateral. There is no pain with calf compression, swelling, warmth, erythema.   Neruologic: Grossly intact via light touch bilateral. Vibratory intact via tuning fork bilateral. Protective threshold with Semmes Wienstein monofilament intact to all pedal sites bilateral.  Negative Tinel sign  Musculoskeletal: Tenderness to palpation along the plantar medial tubercle of the calcaneus at the insertion of plantar fascia on the left and right foot. There is no pain along  the course of the plantar fascia within the arch of the foot. Plantar fascia appears to be intact. There is no pain with lateral compression of the calcaneus or pain with vibratory sensation. There is no pain along the course or insertion of the achilles tendon. No other areas of tenderness to bilateral lower extremities. Muscular strength 5/5 in all groups tested bilateral.  Gait: Unassisted, Nonantalgic.      Assessment & Plan:  46 year old female with bilateral heel pain, likely plantar fasciitis; porokeratosis -Treatment options discussed including all alternatives, risks, and complications -Etiology of symptoms were discussed -X-rays were obtained and reviewed with the patient.  No evidence of acute fracture or stress fracture identified today.  Calcaneal spurring is present. -Patient elects to proceed with steroid injection into the left and right heel. Under sterile skin preparation, a total of 2.5cc of kenalog 10, 0.5% Marcaine plain, and 2% lidocaine plain were infiltrated into the symptomatic area without complication. A band-aid was applied. Patient tolerated the injection well without complication. Post-injection care with discussed with the patient. Discussed with the patient to ice the area over the next couple of days to help prevent a steroid flare.  -Bilateral plantar fascial braces were dispensed -Discussed shoe gear modifications and orthotics -Debrided the hyperkeratotic lesion to the left foot today without any complications or bleeding -Follow-up in 3 weeks or sooner if needed.  Call with any questions or concerns.  Trula Slade DPM

## 2017-08-30 NOTE — Patient Instructions (Signed)

## 2017-08-30 NOTE — Progress Notes (Signed)
   Subjective:    Patient ID: Lindsay Hurley, female    DOB: Feb 17, 1971, 46 y.o.   MRN: [id number]  HPI    Review of Systems     Objective:   Physical Exam        Assessment & Plan:

## 2017-11-27 ENCOUNTER — Other Ambulatory Visit: Payer: Self-pay | Admitting: Family

## 2017-11-27 DIAGNOSIS — Z1231 Encounter for screening mammogram for malignant neoplasm of breast: Secondary | ICD-10-CM

## 2017-12-18 ENCOUNTER — Ambulatory Visit
Admission: RE | Admit: 2017-12-18 | Discharge: 2017-12-18 | Disposition: A | Discharge status: Home / Self Care | Payer: BLUE CROSS/BLUE SHIELD | Source: Ambulatory Visit | Attending: Family | Admitting: Family

## 2017-12-18 DIAGNOSIS — Z1231 Encounter for screening mammogram for malignant neoplasm of breast: Secondary | ICD-10-CM

## 2020-07-14 ENCOUNTER — Other Ambulatory Visit: Payer: Self-pay | Admitting: Family Medicine

## 2020-07-14 DIAGNOSIS — Z1231 Encounter for screening mammogram for malignant neoplasm of breast: Secondary | ICD-10-CM

## 2020-08-16 ENCOUNTER — Other Ambulatory Visit: Payer: Self-pay

## 2020-08-16 ENCOUNTER — Ambulatory Visit
Admission: RE | Admit: 2020-08-16 | Discharge: 2020-08-16 | Disposition: A | Discharge status: Home / Self Care | Payer: BLUE CROSS/BLUE SHIELD | Source: Ambulatory Visit | Attending: Family Medicine | Admitting: Family Medicine

## 2020-08-16 DIAGNOSIS — Z1231 Encounter for screening mammogram for malignant neoplasm of breast: Secondary | ICD-10-CM

## 2021-08-15 ENCOUNTER — Other Ambulatory Visit: Payer: Self-pay | Admitting: Physician Assistant

## 2021-08-15 DIAGNOSIS — Z1231 Encounter for screening mammogram for malignant neoplasm of breast: Secondary | ICD-10-CM

## 2021-10-19 ENCOUNTER — Other Ambulatory Visit: Payer: Self-pay

## 2021-10-19 ENCOUNTER — Ambulatory Visit
Admission: RE | Admit: 2021-10-19 | Discharge: 2021-10-19 | Disposition: A | Discharge status: Home / Self Care | Payer: 59 | Source: Ambulatory Visit | Attending: Physician Assistant | Admitting: Physician Assistant

## 2021-10-19 DIAGNOSIS — Z1231 Encounter for screening mammogram for malignant neoplasm of breast: Secondary | ICD-10-CM

## 2022-01-03 ENCOUNTER — Other Ambulatory Visit: Payer: Self-pay | Admitting: Physician Assistant

## 2022-01-03 DIAGNOSIS — N644 Mastodynia: Secondary | ICD-10-CM

## 2022-01-20 ENCOUNTER — Other Ambulatory Visit: Payer: Self-pay | Admitting: Physician Assistant

## 2022-01-20 ENCOUNTER — Ambulatory Visit
Admission: RE | Admit: 2022-01-20 | Discharge: 2022-01-20 | Disposition: A | Discharge status: Home / Self Care | Payer: 59 | Source: Ambulatory Visit | Attending: Physician Assistant | Admitting: Physician Assistant

## 2022-01-20 ENCOUNTER — Ambulatory Visit: Payer: 59

## 2022-01-20 DIAGNOSIS — N644 Mastodynia: Secondary | ICD-10-CM

## 2022-01-20 DIAGNOSIS — N6489 Other specified disorders of breast: Secondary | ICD-10-CM

## 2022-02-10 ENCOUNTER — Inpatient Hospital Stay: Admission: RE | Admit: 2022-02-10 | Payer: 59 | Source: Ambulatory Visit

## 2022-02-16 ENCOUNTER — Ambulatory Visit
Admission: RE | Admit: 2022-02-16 | Discharge: 2022-02-16 | Disposition: A | Discharge status: Home / Self Care | Payer: 59 | Source: Ambulatory Visit | Attending: Physician Assistant | Admitting: Physician Assistant

## 2022-02-16 DIAGNOSIS — N6489 Other specified disorders of breast: Secondary | ICD-10-CM

## 2024-02-12 ENCOUNTER — Encounter: Payer: Self-pay | Admitting: Internal Medicine

## 2024-02-15 ENCOUNTER — Other Ambulatory Visit: Payer: Self-pay | Admitting: Physician Assistant

## 2024-02-15 DIAGNOSIS — R928 Other abnormal and inconclusive findings on diagnostic imaging of breast: Secondary | ICD-10-CM

## 2024-02-19 ENCOUNTER — Other Ambulatory Visit: Payer: Self-pay | Admitting: Physician Assistant

## 2024-02-19 ENCOUNTER — Encounter: Payer: Self-pay | Admitting: Physician Assistant

## 2024-02-19 DIAGNOSIS — N644 Mastodynia: Secondary | ICD-10-CM

## 2024-02-19 DIAGNOSIS — N6452 Nipple discharge: Secondary | ICD-10-CM

## 2024-03-14 ENCOUNTER — Ambulatory Visit (AMBULATORY_SURGERY_CENTER): Admitting: *Deleted

## 2024-03-14 ENCOUNTER — Encounter: Payer: Self-pay | Admitting: Internal Medicine

## 2024-03-14 VITALS — Ht 64.0 in | Wt 233.0 lb

## 2024-03-14 DIAGNOSIS — Z1211 Encounter for screening for malignant neoplasm of colon: Secondary | ICD-10-CM

## 2024-03-14 MED ORDER — NA SULFATE-K SULFATE-MG SULF 17.5-3.13-1.6 GM/177ML PO SOLN: 1.0000 | Freq: Once | ORAL | 0 refills | Status: AC

## 2024-03-14 NOTE — Progress Notes (Signed)
 Pre visit completed over telephone. Instructions sent through MyChart, secure email, and mail.   No egg or soy allergy known to patient  No issues known to pt with past sedation with any surgeries or procedures Patient denies ever being told they had issues or difficulty with intubation  No FH of Malignant Hyperthermia Pt is not on diet pills Pt is not on  home 02  Pt is not on blood thinners  Pt denies issues with constipation  No A fib or A flutter Have any cardiac testing pending--NO Pt instructed to use Singlecare.com or GoodRx for a price reduction on prep

## 2024-03-28 ENCOUNTER — Encounter: Payer: Self-pay | Admitting: Internal Medicine

## 2024-03-28 ENCOUNTER — Ambulatory Visit: Admitting: Internal Medicine

## 2024-03-28 VITALS — BP 111/61 | HR 70 | Temp 98.0°F | Resp 16 | Ht 64.0 in | Wt 233.0 lb

## 2024-03-28 DIAGNOSIS — K648 Other hemorrhoids: Secondary | ICD-10-CM

## 2024-03-28 DIAGNOSIS — D124 Benign neoplasm of descending colon: Secondary | ICD-10-CM

## 2024-03-28 DIAGNOSIS — K6389 Other specified diseases of intestine: Secondary | ICD-10-CM | POA: Diagnosis not present

## 2024-03-28 DIAGNOSIS — D123 Benign neoplasm of transverse colon: Secondary | ICD-10-CM

## 2024-03-28 DIAGNOSIS — K635 Polyp of colon: Secondary | ICD-10-CM

## 2024-03-28 DIAGNOSIS — Z1211 Encounter for screening for malignant neoplasm of colon: Secondary | ICD-10-CM | POA: Diagnosis present

## 2024-03-28 MED ORDER — SODIUM CHLORIDE 0.9 % IV SOLN: 500.0000 mL | Freq: Once | INTRAVENOUS | Status: DC

## 2024-03-28 NOTE — Op Note (Signed)
 [facility] Patient Name: Lindsay Hurley Procedure Date: 03/28/2024 6:55 AM MRN: [id number] Endoscopist: Freada Jacobs [place] , , [id number] Age: 53 Referring MD:  Date of Birth: 04-13-1971 Gender: Female Account #: [id number] Procedure:                Colonoscopy Indications:              Screening for colorectal malignant neoplasm, This                            is the patient's first colonoscopy Medicines:                Monitored Anesthesia Care Procedure:                Pre-Anesthesia Assessment:                           - Prior to the procedure, a History and Physical                            was performed, and patient medications and                            allergies were reviewed. The patient's tolerance of                            previous anesthesia was also reviewed. The risks                            and benefits of the procedure and the sedation                            options and risks were discussed with the patient.                            All questions were answered, and informed consent                            was obtained. Prior Anticoagulants: The patient has                            taken no anticoagulant or antiplatelet agents. ASA                            Grade Assessment: II - A patient with mild systemic                            disease. After reviewing the risks and benefits,                            the patient was deemed in satisfactory condition to                            undergo the procedure.  After obtaining informed consent, the colonoscope                            was passed under direct vision. Throughout the                            procedure, the patient's blood pressure, pulse, and                            oxygen saturations were monitored continuously. The                            CF [id number] #[id number] was introduced through the anus                            and advanced to  the the terminal ileum. The                            colonoscopy was performed without difficulty. The                            patient tolerated the procedure well. The quality                            of the bowel preparation was excellent. The                            terminal ileum, ileocecal valve, appendiceal                            orifice, and rectum were photographed. Scope In: 8:07:45 AM Scope Out: 8:27:31 AM Scope Withdrawal Time: 0 hours 12 minutes 18 seconds  Total Procedure Duration: 0 hours 19 minutes 46 seconds  Findings:                 The terminal ileum appeared normal.                           Two sessile polyps were found in the descending                            colon and transverse colon. The polyps were 4 to 6                            mm in size. These polyps were removed with a cold                            snare. Resection and retrieval were complete.                           Non-bleeding internal hemorrhoids were found during                            retroflexion. Complications:  No immediate complications. Estimated Blood Loss:     Estimated blood loss was minimal. Impression:               - The examined portion of the ileum was normal.                           - Two 4 to 6 mm polyps in the descending colon and                            in the transverse colon, removed with a cold snare.                            Resected and retrieved.                           - Non-bleeding internal hemorrhoids. Recommendation:           - Discharge patient to home (with escort).                           - Await pathology results.                           - The findings and recommendations were discussed                            with the patient. Dr Pedro Bourgeois "[name]" [name],  03/28/2024 8:34:01 AM

## 2024-03-28 NOTE — Progress Notes (Signed)
 Sedate, gd SR, tolerated procedure well, VSS, report to RN

## 2024-03-28 NOTE — Patient Instructions (Addendum)
-  Handout on polyps, hemorrhoids provided -await pathology results -repeat colonoscopy for surveillance recommended. Date to be determined when pathology result become available   -Continue present medications   YOU HAD AN ENDOSCOPIC PROCEDURE TODAY AT THE [facility]:   Refer to the procedure report that was given to you for any specific questions about what was found during the examination.  If the procedure report does not answer your questions, please call your gastroenterologist to clarify.  If you requested that your care partner not be given the details of your procedure findings, then the procedure report has been included in a sealed envelope for you to review at your convenience later.  YOU SHOULD EXPECT: Some feelings of bloating in the abdomen. Passage of more gas than usual.  Walking can help get rid of the air that was put into your GI tract during the procedure and reduce the bloating. If you had a lower endoscopy (such as a colonoscopy or flexible sigmoidoscopy) you may notice spotting of blood in your stool or on the toilet paper. If you underwent a bowel prep for your procedure, you may not have a normal bowel movement for a few days.  Please Note:  You might notice some irritation and congestion in your nose or some drainage.  This is from the oxygen used during your procedure.  There is no need for concern and it should clear up in a day or so.  SYMPTOMS TO REPORT IMMEDIATELY:  Following lower endoscopy (colonoscopy or flexible sigmoidoscopy):  Excessive amounts of blood in the stool  Significant tenderness or worsening of abdominal pains  Swelling of the abdomen that is new, acute  Fever of 100F or higher   For urgent or emergent issues, a gastroenterologist can be reached at any hour by calling [phone]. Do not use MyChart messaging for urgent concerns.    DIET:  We do recommend a small meal at first, but then you may proceed to your regular diet.   Drink plenty of fluids but you should avoid alcoholic beverages for 24 hours.  ACTIVITY:  You should plan to take it easy for the rest of today and you should NOT DRIVE or use heavy machinery until tomorrow (because of the sedation medicines used during the test).    FOLLOW UP: Our staff will call the number listed on your records the next business day following your procedure.  We will call around 7:15- 8:00 am to check on you and address any questions or concerns that you may have regarding the information given to you following your procedure. If we do not reach you, we will leave a message.     If any biopsies were taken you will be contacted by phone or by letter within the next 1-3 weeks.  Please call us at [phone] if you have not heard about the biopsies in 3 weeks.    SIGNATURES/CONFIDENTIALITY: You and/or your care partner have signed paperwork which will be entered into your electronic medical record.  These signatures attest to the fact that that the information above on your After Visit Summary has been reviewed and is understood.  Full responsibility of the confidentiality of this discharge information lies with you and/or your care-partner.

## 2024-03-28 NOTE — Progress Notes (Signed)
 GASTROENTEROLOGY PROCEDURE H&P NOTE   Primary Care Physician: Virl Grimes, PA-C    Reason for Procedure:   Colon cancer screening  Plan:    Colonoscopy  Patient is appropriate for endoscopic procedure(s) in the ambulatory ([facility]) setting.  The nature of the procedure, as well as the risks, benefits, and alternatives were carefully and thoroughly reviewed with the patient. Ample time for discussion and questions allowed. The patient understood, was satisfied, and agreed to proceed.     HPI: Lindsay Hurley is a 53 y.o. female who presents for colonoscopy for colon cancer screening. Denies blood in stools, changes in bowel habits, or unintentional weight loss. Denies family history of colon cancer.   Past Medical History:  Diagnosis Date   Anemia    Herpes genitalia    Hyperlipidemia     Past Surgical History:  Procedure Laterality Date   ABDOMINAL HYSTERECTOMY N/A 01/14/2014   Procedure: HYSTERECTOMY ABDOMINAL TOTAL;  Surgeon: Heide Livings, MD;  Location: [facility];  Service: Gynecology;  Laterality: N/A;   BILATERAL SALPINGECTOMY Bilateral 01/14/2014   Procedure: BILATERAL SALPINGECTOMY;  Surgeon: Heide Livings, MD;  Location: [facility];  Service: Gynecology;  Laterality: Bilateral;   CERVICAL POLYPECTOMY     CESAREAN SECTION  03/26/2012   Procedure: CESAREAN SECTION;  Surgeon: Heide Livings, MD;  Location: [facility];  Service: Gynecology;  Laterality: N/A;   CHOLECYSTECTOMY N/A 11/29/2012   Procedure: LAPAROSCOPIC CHOLECYSTECTOMY WITH INTRAOPERATIVE CHOLANGIOGRAM;  Surgeon: Shela Derby, MD;  Location: [facility];  Service: General;  Laterality: N/A;   ERCP N/A 11/29/2012   Procedure: ENDOSCOPIC RETROGRADE CHOLANGIOPANCREATOGRAPHY (ERCP);  Surgeon: Asencion Blacksmith, MD,FACG;  Location: [facility];  Service: Endoscopy;  Laterality: N/A;   HYSTEROSCOPY     TUBAL LIGATION      Prior to Admission medications   Medication Sig Start Date End Date Taking?  Authorizing Provider  Omega-3 Fatty Acids (FISH OIL) 1000 MG CAPS Take 1 g by mouth. 10/19/21  Yes [provider]  rosuvastatin (CRESTOR) 20 MG tablet Take 1 tablet by mouth daily. 11/13/22  Yes [provider]    Current Outpatient Medications  Medication Sig Dispense Refill   Omega-3 Fatty Acids (FISH OIL) 1000 MG CAPS Take 1 g by mouth.     rosuvastatin (CRESTOR) 20 MG tablet Take 1 tablet by mouth daily.     Current Facility-Administered Medications  Medication Dose Route Frequency Provider Last Rate Last Admin   0.9 %  sodium chloride  infusion  500 mL Intravenous Once Daina Drum, MD        Allergies as of 03/28/2024 - Review Complete 03/28/2024  Allergen Reaction Noted   Penicillins Hives 10/31/2011    Family History  Problem Relation Age of Onset   Hypertension Mother    Alcohol abuse Father    Throat cancer Father    Asthma Brother    Diabetes Maternal Aunt    Stroke Maternal Aunt    Alcohol abuse Maternal Uncle    Drug abuse Maternal Uncle    Cancer Maternal Uncle    Alcohol abuse Paternal Uncle    Drug abuse Paternal Uncle    Esophageal cancer Maternal Grandmother    Cancer Maternal Grandmother        Lung   COPD Maternal Grandfather    Healthy Daughter    Healthy Son    Depression Cousin    Anesthesia problems Neg Hx    Breast cancer Neg Hx    Colon polyps  Neg Hx    Colon cancer Neg Hx    Rectal cancer Neg Hx    Stomach cancer Neg Hx     Social History   Socioeconomic History   Marital status: Married    Spouse name: Not on file   Number of children: 3   Years of education: Not on file   Highest education level: Not on file  Occupational History   Occupation: Museum/gallery curator  Tobacco Use   Smoking status: Never   Smokeless tobacco: Never  Substance and Sexual Activity   Alcohol use: No   Drug use: No   Sexual activity: Yes    Comment: tubal ligation  Other Topics Concern   Not on file  Social History Narrative    Occasional caffeine use.  Lives with husband and 1 of her children in a one story home.  Has 3 children.     Works as an Librarian, academic for a school.     Education: college.   Social Drivers of Corporate investment banker Strain: Not on file  Food Insecurity: Not on file  Transportation Needs: Not on file  Physical Activity: Not on file  Stress: Not on file  Social Connections: Not on file  Intimate Partner Violence: Not on file    Physical Exam: Vital signs in last 24 hours: BP 117/76   Pulse 68   Temp 98 F (36.7 C)   Ht 5\' 4"  (1.626 m)   Wt 233 lb (105.7 kg)   LMP 11/16/2015   SpO2 100%   BMI 39.99 kg/m  GEN: NAD EYE: Sclerae anicteric ENT: MMM CV: Non-tachycardic Pulm: No increased work of breathing GI: Soft, NT/ND NEURO:  Alert & Oriented   Regino Caprio, MD [facility]  03/28/2024 8:03 AM

## 2024-03-28 NOTE — Progress Notes (Signed)
 Called to room to assist during endoscopic procedure.  Patient ID and intended procedure confirmed with present staff. Received instructions for my participation in the procedure from the performing physician.

## 2024-03-28 NOTE — Progress Notes (Signed)
 Pt's states no medical or surgical changes since previsit or office visit.

## 2024-03-31 ENCOUNTER — Telehealth: Payer: Self-pay

## 2024-03-31 NOTE — Telephone Encounter (Signed)
 Left message

## 2024-04-01 ENCOUNTER — Ambulatory Visit: Payer: Self-pay | Admitting: Internal Medicine

## 2024-04-01 LAB — SURGICAL PATHOLOGY

## 2024-04-04 ENCOUNTER — Ambulatory Visit

## 2024-04-04 ENCOUNTER — Ambulatory Visit
Admission: RE | Admit: 2024-04-04 | Discharge: 2024-04-04 | Disposition: A | Discharge status: Home / Self Care | Source: Ambulatory Visit | Attending: Physician Assistant | Admitting: Physician Assistant

## 2024-04-04 DIAGNOSIS — N644 Mastodynia: Secondary | ICD-10-CM

## 2024-04-11 ENCOUNTER — Other Ambulatory Visit: Payer: Self-pay | Admitting: Physician Assistant

## 2024-04-11 DIAGNOSIS — Z1231 Encounter for screening mammogram for malignant neoplasm of breast: Secondary | ICD-10-CM

## 2024-08-20 ENCOUNTER — Other Ambulatory Visit: Payer: Self-pay | Admitting: Physician Assistant

## 2024-08-20 DIAGNOSIS — Z1231 Encounter for screening mammogram for malignant neoplasm of breast: Secondary | ICD-10-CM

## 2024-11-06 ENCOUNTER — Other Ambulatory Visit: Payer: Self-pay | Admitting: Physician Assistant

## 2024-11-06 DIAGNOSIS — Z1231 Encounter for screening mammogram for malignant neoplasm of breast: Secondary | ICD-10-CM

## 2024-12-24 ENCOUNTER — Ambulatory Visit: Admitting: Dermatology

## 2025-04-10 ENCOUNTER — Ambulatory Visit

## 2025-10-02 ENCOUNTER — Ambulatory Visit
